# Patient Record
Sex: Male | Born: 1960 | Race: White | Hispanic: No | Marital: Married | Smoking: Current every day smoker
Health system: Southern US, Community
[De-identification: ages and names within clinical notes are randomized; demographics above are authoritative.]

## PROBLEM LIST (undated history)

## (undated) DIAGNOSIS — K649 Unspecified hemorrhoids: Secondary | ICD-10-CM

## (undated) DIAGNOSIS — I10 Essential (primary) hypertension: Secondary | ICD-10-CM

## (undated) DIAGNOSIS — M1990 Unspecified osteoarthritis, unspecified site: Secondary | ICD-10-CM

## (undated) DIAGNOSIS — K219 Gastro-esophageal reflux disease without esophagitis: Secondary | ICD-10-CM

## (undated) DIAGNOSIS — J309 Allergic rhinitis, unspecified: Secondary | ICD-10-CM

## (undated) HISTORY — DX: Allergic rhinitis, unspecified: J30.9

## (undated) HISTORY — DX: Unspecified hemorrhoids: K64.9

## (undated) HISTORY — DX: Gastro-esophageal reflux disease without esophagitis: K21.9

## (undated) HISTORY — PX: APPENDECTOMY: SHX54

## (undated) HISTORY — PX: KNEE SURGERY: SHX244

---

## 2013-06-11 ENCOUNTER — Other Ambulatory Visit (HOSPITAL_COMMUNITY): Payer: Self-pay | Admitting: Orthopaedic Surgery

## 2013-07-16 ENCOUNTER — Encounter (HOSPITAL_COMMUNITY): Payer: Self-pay | Admitting: Pharmacy Technician

## 2013-07-18 ENCOUNTER — Encounter (HOSPITAL_COMMUNITY)
Admission: RE | Admit: 2013-07-18 | Discharge: 2013-07-18 | Disposition: A | Discharge status: Home / Self Care | Payer: BC Managed Care – PPO | Source: Ambulatory Visit | Attending: Orthopaedic Surgery | Admitting: Orthopaedic Surgery

## 2013-07-18 ENCOUNTER — Encounter (HOSPITAL_COMMUNITY): Payer: Self-pay

## 2013-07-18 DIAGNOSIS — Z01810 Encounter for preprocedural cardiovascular examination: Secondary | ICD-10-CM | POA: Insufficient documentation

## 2013-07-18 DIAGNOSIS — Z01818 Encounter for other preprocedural examination: Secondary | ICD-10-CM | POA: Insufficient documentation

## 2013-07-18 DIAGNOSIS — Z01812 Encounter for preprocedural laboratory examination: Secondary | ICD-10-CM | POA: Insufficient documentation

## 2013-07-18 HISTORY — DX: Unspecified osteoarthritis, unspecified site: M19.90

## 2013-07-18 HISTORY — DX: Essential (primary) hypertension: I10

## 2013-07-18 LAB — COMPREHENSIVE METABOLIC PANEL
Albumin: 4.1 g/dL (ref 3.5–5.2)
BUN: 11 mg/dL (ref 6–23)
Chloride: 99 mEq/L (ref 96–112)
Creatinine, Ser: 0.94 mg/dL (ref 0.50–1.35)
GFR calc non Af Amer: >90 mL/min (ref >90)
Total Bilirubin: 0.3 mg/dL (ref 0.3–1.2)
Total Protein: 7.6 g/dL (ref 6.0–8.3)

## 2013-07-18 LAB — PROTIME-INR
INR: 1.11 (ref 0.00–1.49)
Prothrombin Time: 14.1 seconds (ref 11.6–15.2)

## 2013-07-18 LAB — URINALYSIS, ROUTINE W REFLEX MICROSCOPIC
Bilirubin Urine: NEGATIVE
Nitrite: NEGATIVE
Protein, ur: 30 mg/dL — AB
Specific Gravity, Urine: 1.015 (ref 1.005–1.030)
Urobilinogen, UA: 0.2 mg/dL (ref 0.0–1.0)

## 2013-07-18 LAB — CBC
HCT: 40.8 % (ref 39.0–52.0)
Hemoglobin: 14.2 g/dL (ref 13.0–17.0)
MCHC: 34.8 g/dL (ref 30.0–36.0)
MCV: 90.1 fL (ref 78.0–100.0)
RDW: 12.5 % (ref 11.5–15.5)
WBC: 8.1 10*3/uL (ref 4.0–10.5)

## 2013-07-18 LAB — ABO/RH: ABO/RH(D): A POS

## 2013-07-18 LAB — URINE MICROSCOPIC-ADD ON

## 2013-07-18 LAB — APTT: aPTT: 27 seconds (ref 24–37)

## 2013-07-18 LAB — TYPE AND SCREEN
ABO/RH(D): A POS
Antibody Screen: NEGATIVE

## 2013-07-18 NOTE — Pre-Procedure Instructions (Signed)
Vashaun Osmon  07/18/2013   Your procedure is scheduled on:  November 24  Report to [facility] Entrance "A" [address] at 10:30 AM.  Call this number if you have problems the morning of surgery: [phone]   Remember:   Do not eat food or drink liquids after midnight.   Take these medicines the morning of surgery with A SIP OF WATER: Hydrocodone (if needed)   STOP Aspirin, Aleve, Naproxen, Advil, Ibuprofen, Vitamin, Herbs, or Supplements starting today   Do not wear jewelry, make-up or nail polish.  Do not wear lotions, powders, or perfumes. You may wear deodorant.  Do not shave 48 hours prior to surgery. Men may shave face and neck.  Do not bring valuables to the hospital.  [facility] is not responsible                  for any belongings or valuables.               Contacts, dentures or bridgework may not be worn into surgery.  Leave suitcase in the car. After surgery it may be brought to your room.  For patients admitted to the hospital, discharge time is determined by your                treatment team.               Special Instructions: Shower using CHG 2 nights before surgery and the night before surgery.  If you shower the day of surgery use CHG.  Use special wash - you have one bottle of CHG for all showers.  You should use approximately 1/3 of the bottle for each shower.   Please read over the following fact sheets that you were given: Pain Booklet, Coughing and Deep Breathing, Blood Transfusion Information, Total Joint Packet and Surgical Site Infection Prevention

## 2013-07-23 NOTE — H&P (Signed)
TOTAL KNEE ADMISSION H&P  Patient is being admitted for right total knee arthroplasty.  Subjective:  Chief Complaint:right knee pain.  HPI: Gregory Marks, 52 y.o. male, has a history of pain and functional disability in the right knee due to arthritis and has failed non-surgical conservative treatments for greater than 12 weeks to includeNSAID's and/or analgesics and corticosteriod injections.  Onset of symptoms was gradual, starting 2 years ago with gradually worsening course since that time.  Patient currently rates pain in the right knee(s) at 7 out of 10 with activity. Patient has night pain, worsening of pain with activity and weight bearing and pain that interferes with activities of daily living.  Patient has evidence of periarticular osteophytes and joint space narrowing by imaging studies.There is no active infection.  He had a previous meniscectomy years ago and had problems with postoperative infection.  Had a washout procedure and PICC line IV antibiotics.  There are no active problems to display for this patient.  Past Medical History  Diagnosis Date  . Hypertension     does not see a cardiologist  . Osteoarthritis     Past Surgical History  Procedure Laterality Date  . Appendectomy      age 60  . Knee surgery  1976, 1992    right     No prescriptions prior to admission   No Known Allergies  History  Substance Use Topics  . Smoking status: Never Smoker   . Smokeless tobacco: Not on file  . Alcohol Use: Yes     Comment: reports beer every now and then    No family history on file.   Review of Systems  Musculoskeletal: Positive for joint pain.       Right knee pain  All other systems reviewed and are negative.    Objective:  Physical Exam  Constitutional: He is oriented to person, place, and time. He appears well-developed and well-nourished.  HENT:  Head: Normocephalic and atraumatic.  Eyes: EOM are normal. Pupils are equal, round, and reactive to light.   Neck: Normal range of motion.  Cardiovascular: Normal rate.   Respiratory: Effort normal.  GI: Soft.  Musculoskeletal:   Good hip range of motion, symmetrical.  Has 45 degrees internal/external rotation.  Knee reaches within 5 degrees full extension on the right.  He flexes 120 degrees on the right.  Opposite left knee shows full flexion and extension, 135-0 degrees.  No knee effusion on the left knee.  The right knee shows 2+ effusion.  Cruciate ligament exam is normal.  No calf tenderness.  Distal pulses are 2+.  Negative straight leg raising.  No popliteal masses.   Neurological: He is alert and oriented to person, place, and time.  Skin: Skin is warm and dry.  Psychiatric: He has a normal mood and affect.    Vital signs in last 24 hours:    Labs:   There is no height or weight on file to calculate BMI.   Imaging Review Plain radiographs demonstrate severe degenerative joint disease of the right knee(s). The overall alignment issignificant varus. The bone quality appears to be adequate for age and reported activity level.  Assessment/Plan:  End stage arthritis, right knee   The patient history, physical examination, clinical judgment of the provider and imaging studies are consistent with end stage degenerative joint disease of the right knee(s) and total knee arthroplasty is deemed medically necessary. The treatment options including medical management, injection therapy arthroscopy and arthroplasty were discussed at length.  The risks and benefits of total knee arthroplasty were presented and reviewed. The risks due to aseptic loosening, infection, stiffness, patella tracking problems, thromboembolic complications and other imponderables were discussed. The patient acknowledged the explanation, agreed to proceed with the plan and consent was signed. Patient is being admitted for inpatient treatment for surgery, pain control, PT, OT, prophylactic antibiotics, VTE prophylaxis,  progressive ambulation and ADL's and discharge planning. The patient is planning to be discharged home with home health services

## 2013-07-27 MED ORDER — CEFAZOLIN SODIUM-DEXTROSE 2-3 GM-% IV SOLR
2.0000 g | INTRAVENOUS | Status: AC
  Administered 2013-07-28: 2 g via INTRAVENOUS
  Filled 2013-07-27: qty 50

## 2013-07-28 ENCOUNTER — Encounter (HOSPITAL_COMMUNITY)
Admission: RE | Disposition: A | Discharge status: Home Health | Payer: Self-pay | Source: Ambulatory Visit | Attending: Orthopaedic Surgery

## 2013-07-28 ENCOUNTER — Encounter (HOSPITAL_COMMUNITY): Payer: BC Managed Care – PPO | Admitting: Certified Registered Nurse Anesthetist

## 2013-07-28 ENCOUNTER — Inpatient Hospital Stay (HOSPITAL_COMMUNITY)
Admission: RE | Admit: 2013-07-28 | Discharge: 2013-07-30 | DRG: 470 | Disposition: A | Discharge status: Home Health | Payer: BC Managed Care – PPO | Source: Ambulatory Visit | Attending: Orthopaedic Surgery | Admitting: Orthopaedic Surgery

## 2013-07-28 ENCOUNTER — Inpatient Hospital Stay (HOSPITAL_COMMUNITY): Payer: BC Managed Care – PPO | Admitting: Certified Registered Nurse Anesthetist

## 2013-07-28 ENCOUNTER — Encounter (HOSPITAL_COMMUNITY): Payer: Self-pay | Admitting: *Deleted

## 2013-07-28 DIAGNOSIS — I10 Essential (primary) hypertension: Secondary | ICD-10-CM | POA: Diagnosis present

## 2013-07-28 DIAGNOSIS — Z9089 Acquired absence of other organs: Secondary | ICD-10-CM

## 2013-07-28 DIAGNOSIS — M1711 Unilateral primary osteoarthritis, right knee: Secondary | ICD-10-CM | POA: Diagnosis present

## 2013-07-28 DIAGNOSIS — M1710 Unilateral primary osteoarthritis, unspecified knee: Principal | ICD-10-CM | POA: Diagnosis present

## 2013-07-28 HISTORY — PX: KNEE ARTHROPLASTY: SHX992

## 2013-07-28 SURGERY — ARTHROPLASTY, KNEE, TOTAL, USING IMAGELESS COMPUTER-ASSISTED NAVIGATION
Anesthesia: General | Site: Knee | Laterality: Right | Wound class: Clean

## 2013-07-28 MED ORDER — HYDROMORPHONE HCL PF 1 MG/ML IJ SOLN
INTRAMUSCULAR | Status: AC
  Administered 2013-07-28: 0.5 mg via INTRAVENOUS
  Filled 2013-07-28: qty 1

## 2013-07-28 MED ORDER — ACETAMINOPHEN 325 MG PO TABS: 650.0000 mg | ORAL_TABLET | Freq: Four times a day (QID) | ORAL | Status: DC | PRN

## 2013-07-28 MED ORDER — MIDAZOLAM HCL 2 MG/2ML IJ SOLN: 2.0000 mg | Freq: Once | INTRAMUSCULAR | Status: DC

## 2013-07-28 MED ORDER — ONDANSETRON HCL 4 MG PO TABS: 4.0000 mg | ORAL_TABLET | Freq: Four times a day (QID) | ORAL | Status: DC | PRN

## 2013-07-28 MED ORDER — OXYCODONE HCL 5 MG PO TABS: 5.0000 mg | ORAL_TABLET | Freq: Once | ORAL | Status: DC | PRN

## 2013-07-28 MED ORDER — LACTATED RINGERS IV SOLN
INTRAVENOUS | Status: DC | PRN
  Administered 2013-07-28 (×2): via INTRAVENOUS

## 2013-07-28 MED ORDER — ONDANSETRON HCL 4 MG/2ML IJ SOLN
INTRAMUSCULAR | Status: DC | PRN
  Administered 2013-07-28: 4 mg via INTRAVENOUS

## 2013-07-28 MED ORDER — AMLODIPINE BESYLATE 10 MG PO TABS
10.0000 mg | ORAL_TABLET | Freq: Every day | ORAL | Status: DC
  Administered 2013-07-29 – 2013-07-30 (×2): 10 mg via ORAL
  Filled 2013-07-28 (×2): qty 1

## 2013-07-28 MED ORDER — ARTIFICIAL TEARS OP OINT
TOPICAL_OINTMENT | OPHTHALMIC | Status: DC | PRN
  Administered 2013-07-28: 1 via OPHTHALMIC

## 2013-07-28 MED ORDER — METHOCARBAMOL 100 MG/ML IJ SOLN
500.0000 mg | Freq: Four times a day (QID) | INTRAVENOUS | Status: DC | PRN
  Filled 2013-07-28: qty 5

## 2013-07-28 MED ORDER — BUPIVACAINE-EPINEPHRINE PF 0.5-1:200000 % IJ SOLN
INTRAMUSCULAR | Status: DC | PRN
  Administered 2013-07-28: 25 mL via PERINEURAL

## 2013-07-28 MED ORDER — GLYCOPYRROLATE 0.2 MG/ML IJ SOLN
INTRAMUSCULAR | Status: DC | PRN
  Administered 2013-07-28: 0.6 mg via INTRAVENOUS

## 2013-07-28 MED ORDER — ONDANSETRON HCL 4 MG/2ML IJ SOLN
4.0000 mg | Freq: Four times a day (QID) | INTRAMUSCULAR | Status: DC | PRN
  Administered 2013-07-29: 4 mg via INTRAVENOUS
  Filled 2013-07-28: qty 2

## 2013-07-28 MED ORDER — METOCLOPRAMIDE HCL 5 MG/ML IJ SOLN
5.0000 mg | Freq: Three times a day (TID) | INTRAMUSCULAR | Status: DC | PRN
  Administered 2013-07-29: 10 mg via INTRAVENOUS
  Filled 2013-07-28: qty 2

## 2013-07-28 MED ORDER — METHOCARBAMOL 500 MG PO TABS
ORAL_TABLET | ORAL | Status: AC
  Administered 2013-07-29: 500 mg via ORAL
  Filled 2013-07-28: qty 1

## 2013-07-28 MED ORDER — KETOROLAC TROMETHAMINE 30 MG/ML IJ SOLN
INTRAMUSCULAR | Status: AC
  Filled 2013-07-28: qty 1

## 2013-07-28 MED ORDER — HYDROMORPHONE HCL PF 1 MG/ML IJ SOLN
0.2500 mg | INTRAMUSCULAR | Status: DC | PRN
  Administered 2013-07-28 (×3): 0.5 mg via INTRAVENOUS

## 2013-07-28 MED ORDER — FENTANYL CITRATE 0.05 MG/ML IJ SOLN
100.0000 ug | Freq: Once | INTRAMUSCULAR | Status: AC
  Administered 2013-07-28: 100 ug via INTRAVENOUS

## 2013-07-28 MED ORDER — ASPIRIN EC 325 MG PO TBEC: 325.0000 mg | DELAYED_RELEASE_TABLET | Freq: Every day | ORAL | Status: DC

## 2013-07-28 MED ORDER — FENTANYL CITRATE 0.05 MG/ML IJ SOLN
INTRAMUSCULAR | Status: AC
  Filled 2013-07-28: qty 2

## 2013-07-28 MED ORDER — AMLODIPINE BESY-BENAZEPRIL HCL 10-20 MG PO CAPS: 1.0000 | ORAL_CAPSULE | Freq: Every day | ORAL | Status: DC

## 2013-07-28 MED ORDER — ZOLPIDEM TARTRATE 5 MG PO TABS: 5.0000 mg | ORAL_TABLET | Freq: Every evening | ORAL | Status: DC | PRN

## 2013-07-28 MED ORDER — PHENOL 1.4 % MT LIQD: 1.0000 | OROMUCOSAL | Status: DC | PRN

## 2013-07-28 MED ORDER — MENTHOL 3 MG MT LOZG: 1.0000 | LOZENGE | OROMUCOSAL | Status: DC | PRN

## 2013-07-28 MED ORDER — METOCLOPRAMIDE HCL 10 MG PO TABS: 5.0000 mg | ORAL_TABLET | Freq: Three times a day (TID) | ORAL | Status: DC | PRN

## 2013-07-28 MED ORDER — OXYCODONE HCL 5 MG/5ML PO SOLN: 5.0000 mg | Freq: Once | ORAL | Status: DC | PRN

## 2013-07-28 MED ORDER — NEOSTIGMINE METHYLSULFATE 1 MG/ML IJ SOLN
INTRAMUSCULAR | Status: DC | PRN
  Administered 2013-07-28: 4 mg via INTRAVENOUS

## 2013-07-28 MED ORDER — ACETAMINOPHEN 650 MG RE SUPP: 650.0000 mg | Freq: Four times a day (QID) | RECTAL | Status: DC | PRN

## 2013-07-28 MED ORDER — MIDAZOLAM HCL 2 MG/2ML IJ SOLN
INTRAMUSCULAR | Status: AC
  Filled 2013-07-28: qty 2

## 2013-07-28 MED ORDER — FLEET ENEMA 7-19 GM/118ML RE ENEM: 1.0000 | ENEMA | Freq: Once | RECTAL | Status: AC | PRN

## 2013-07-28 MED ORDER — LIDOCAINE HCL (CARDIAC) 20 MG/ML IV SOLN
INTRAVENOUS | Status: DC | PRN
  Administered 2013-07-28: 80 mg via INTRAVENOUS

## 2013-07-28 MED ORDER — PROPOFOL 10 MG/ML IV BOLUS
INTRAVENOUS | Status: DC | PRN
  Administered 2013-07-28: 200 mg via INTRAVENOUS

## 2013-07-28 MED ORDER — BENAZEPRIL HCL 20 MG PO TABS
20.0000 mg | ORAL_TABLET | Freq: Every day | ORAL | Status: DC
  Administered 2013-07-29 – 2013-07-30 (×2): 20 mg via ORAL
  Filled 2013-07-28 (×2): qty 1

## 2013-07-28 MED ORDER — METHOCARBAMOL 500 MG PO TABS
500.0000 mg | ORAL_TABLET | Freq: Four times a day (QID) | ORAL | Status: DC | PRN
  Administered 2013-07-28 – 2013-07-29 (×4): 500 mg via ORAL
  Filled 2013-07-28 (×5): qty 1

## 2013-07-28 MED ORDER — DOCUSATE SODIUM 100 MG PO CAPS
100.0000 mg | ORAL_CAPSULE | Freq: Two times a day (BID) | ORAL | Status: DC
  Administered 2013-07-29 – 2013-07-30 (×3): 100 mg via ORAL
  Filled 2013-07-28 (×5): qty 1

## 2013-07-28 MED ORDER — OXYCODONE-ACETAMINOPHEN 5-325 MG PO TABS: 1.0000 | ORAL_TABLET | ORAL | Status: DC | PRN

## 2013-07-28 MED ORDER — METHOCARBAMOL 500 MG PO TABS: 500.0000 mg | ORAL_TABLET | Freq: Four times a day (QID) | ORAL | Status: DC | PRN

## 2013-07-28 MED ORDER — KCL IN DEXTROSE-NACL 20-5-0.45 MEQ/L-%-% IV SOLN
INTRAVENOUS | Status: DC
  Administered 2013-07-28 – 2013-07-29 (×2): via INTRAVENOUS
  Filled 2013-07-28 (×4): qty 1000

## 2013-07-28 MED ORDER — OXYCODONE HCL 5 MG PO TABS
ORAL_TABLET | ORAL | Status: AC
  Administered 2013-07-29: 10 mg via ORAL
  Filled 2013-07-28: qty 1

## 2013-07-28 MED ORDER — MIDAZOLAM HCL 2 MG/2ML IJ SOLN
INTRAMUSCULAR | Status: AC
  Administered 2013-07-28: 2 mg
  Filled 2013-07-28: qty 2

## 2013-07-28 MED ORDER — HYDROMORPHONE HCL PF 1 MG/ML IJ SOLN
1.0000 mg | INTRAMUSCULAR | Status: DC | PRN
  Administered 2013-07-28: 0.5 mg via INTRAVENOUS
  Administered 2013-07-28 – 2013-07-30 (×6): 1 mg via INTRAVENOUS
  Filled 2013-07-28 (×7): qty 1

## 2013-07-28 MED ORDER — SODIUM CHLORIDE 0.9 % IR SOLN
Status: DC | PRN
  Administered 2013-07-28: 3000 mL

## 2013-07-28 MED ORDER — ASPIRIN EC 325 MG PO TBEC
325.0000 mg | DELAYED_RELEASE_TABLET | Freq: Every day | ORAL | Status: DC
  Administered 2013-07-29 – 2013-07-30 (×2): 325 mg via ORAL
  Filled 2013-07-28 (×4): qty 1

## 2013-07-28 MED ORDER — DIPHENHYDRAMINE HCL 12.5 MG/5ML PO ELIX: 12.5000 mg | ORAL_SOLUTION | ORAL | Status: DC | PRN

## 2013-07-28 MED ORDER — KETOROLAC TROMETHAMINE 15 MG/ML IJ SOLN
15.0000 mg | Freq: Four times a day (QID) | INTRAMUSCULAR | Status: AC
  Administered 2013-07-28 – 2013-07-29 (×3): 15 mg via INTRAVENOUS
  Filled 2013-07-28 (×3): qty 1

## 2013-07-28 MED ORDER — MIDAZOLAM HCL 2 MG/2ML IJ SOLN
1.0000 mg | INTRAMUSCULAR | Status: AC | PRN
  Administered 2013-07-28 (×2): 1 mg via INTRAVENOUS

## 2013-07-28 MED ORDER — LACTATED RINGERS IV SOLN
INTRAVENOUS | Status: DC
  Administered 2013-07-28: 11:00:00 via INTRAVENOUS

## 2013-07-28 MED ORDER — METOCLOPRAMIDE HCL 5 MG/ML IJ SOLN: 10.0000 mg | Freq: Once | INTRAMUSCULAR | Status: DC | PRN

## 2013-07-28 MED ORDER — SENNOSIDES-DOCUSATE SODIUM 8.6-50 MG PO TABS: 1.0000 | ORAL_TABLET | Freq: Every evening | ORAL | Status: DC | PRN

## 2013-07-28 MED ORDER — OXYCODONE HCL 5 MG PO TABS
5.0000 mg | ORAL_TABLET | ORAL | Status: DC | PRN
  Administered 2013-07-28 – 2013-07-30 (×9): 10 mg via ORAL
  Filled 2013-07-28 (×9): qty 2

## 2013-07-28 MED ORDER — BISACODYL 10 MG RE SUPP: 10.0000 mg | Freq: Every day | RECTAL | Status: DC | PRN

## 2013-07-28 MED ORDER — DEXAMETHASONE SODIUM PHOSPHATE 4 MG/ML IJ SOLN
INTRAMUSCULAR | Status: DC | PRN
  Administered 2013-07-28: 8 mg via INTRAVENOUS

## 2013-07-28 MED ORDER — FENTANYL CITRATE 0.05 MG/ML IJ SOLN
INTRAMUSCULAR | Status: DC | PRN
  Administered 2013-07-28: 50 ug via INTRAVENOUS
  Administered 2013-07-28: 100 ug via INTRAVENOUS
  Administered 2013-07-28 (×2): 50 ug via INTRAVENOUS
  Administered 2013-07-28: 100 ug via INTRAVENOUS
  Administered 2013-07-28: 50 ug via INTRAVENOUS

## 2013-07-28 MED ORDER — ROCURONIUM BROMIDE 100 MG/10ML IV SOLN
INTRAVENOUS | Status: DC | PRN
  Administered 2013-07-28: 50 mg via INTRAVENOUS

## 2013-07-28 MED ORDER — MIDAZOLAM HCL 5 MG/5ML IJ SOLN
INTRAMUSCULAR | Status: DC | PRN
  Administered 2013-07-28: 2 mg via INTRAVENOUS

## 2013-07-28 SURGICAL SUPPLY — 66 items
BANDAGE ELASTIC 4 VELCRO ST LF (GAUZE/BANDAGES/DRESSINGS) ×2 IMPLANT
BANDAGE ELASTIC 6 VELCRO ST LF (GAUZE/BANDAGES/DRESSINGS) ×2 IMPLANT
BANDAGE ESMARK 6X9 LF (GAUZE/BANDAGES/DRESSINGS) ×1 IMPLANT
BENZOIN TINCTURE PRP APPL 2/3 (GAUZE/BANDAGES/DRESSINGS) ×2 IMPLANT
BLADE KNIFE  10 PERSONNA (BLADE) ×2 IMPLANT
BLADE SAGITTAL 25.0X1.19X90 (BLADE) ×2 IMPLANT
BLADE SAW SGTL 13X75X1.27 (BLADE) ×2 IMPLANT
BNDG ELASTIC 6X10 VLCR STRL LF (GAUZE/BANDAGES/DRESSINGS) ×2 IMPLANT
BNDG ESMARK 6X9 LF (GAUZE/BANDAGES/DRESSINGS) ×2
BOWL SMART MIX CTS (DISPOSABLE) ×2 IMPLANT
CAPT RP KNEE ×2 IMPLANT
CEMENT HV SMART SET (Cement) ×4 IMPLANT
CLOTH BEACON ORANGE TIMEOUT ST (SAFETY) ×2 IMPLANT
COVER BACK TABLE 24X17X13 BIG (DRAPES) IMPLANT
COVER SURGICAL LIGHT HANDLE (MISCELLANEOUS) ×2 IMPLANT
CUFF TOURNIQUET SINGLE 34IN LL (TOURNIQUET CUFF) ×2 IMPLANT
CUFF TOURNIQUET SINGLE 44IN (TOURNIQUET CUFF) IMPLANT
DRAPE ORTHO SPLIT 77X108 STRL (DRAPES) ×2
DRAPE SURG ORHT 6 SPLT 77X108 (DRAPES) ×2 IMPLANT
DRAPE U-SHAPE 47X51 STRL (DRAPES) ×2 IMPLANT
DRSG PAD ABDOMINAL 8X10 ST (GAUZE/BANDAGES/DRESSINGS) ×2 IMPLANT
DURAPREP 26ML APPLICATOR (WOUND CARE) ×2 IMPLANT
ELECT REM PT RETURN 9FT ADLT (ELECTROSURGICAL) ×2
ELECTRODE REM PT RTRN 9FT ADLT (ELECTROSURGICAL) ×1 IMPLANT
FACESHIELD LNG OPTICON STERILE (SAFETY) ×4 IMPLANT
GAUZE XEROFORM 5X9 LF (GAUZE/BANDAGES/DRESSINGS) ×2 IMPLANT
GLOVE BIOGEL PI IND STRL 7.5 (GLOVE) ×1 IMPLANT
GLOVE BIOGEL PI IND STRL 8 (GLOVE) ×1 IMPLANT
GLOVE BIOGEL PI INDICATOR 7.5 (GLOVE) ×1
GLOVE BIOGEL PI INDICATOR 8 (GLOVE) ×1
GLOVE ECLIPSE 7.0 STRL STRAW (GLOVE) ×2 IMPLANT
GLOVE ORTHO TXT STRL SZ7.5 (GLOVE) ×2 IMPLANT
GOWN PREVENTION PLUS LG XLONG (DISPOSABLE) IMPLANT
GOWN PREVENTION PLUS XLARGE (GOWN DISPOSABLE) ×2 IMPLANT
GOWN STRL NON-REIN LRG LVL3 (GOWN DISPOSABLE) ×6 IMPLANT
HANDPIECE INTERPULSE COAX TIP (DISPOSABLE) ×1
IMMOBILIZER KNEE 22 UNIV (SOFTGOODS) ×2 IMPLANT
KIT BASIN OR (CUSTOM PROCEDURE TRAY) ×2 IMPLANT
KIT ROOM TURNOVER OR (KITS) ×2 IMPLANT
MANIFOLD NEPTUNE II (INSTRUMENTS) ×2 IMPLANT
MARKER SPHERE PSV REFLC THRD 5 (MARKER) ×6 IMPLANT
NEEDLE 1/2 CIR MAYO (NEEDLE) IMPLANT
NEEDLE HYPO 25GX1X1/2 BEV (NEEDLE) ×2 IMPLANT
NS IRRIG 1000ML POUR BTL (IV SOLUTION) ×2 IMPLANT
PACK TOTAL JOINT (CUSTOM PROCEDURE TRAY) ×2 IMPLANT
PAD ARMBOARD 7.5X6 YLW CONV (MISCELLANEOUS) ×4 IMPLANT
PAD CAST 4YDX4 CTTN HI CHSV (CAST SUPPLIES) ×1 IMPLANT
PADDING CAST COTTON 4X4 STRL (CAST SUPPLIES) ×1
PADDING CAST COTTON 6X4 STRL (CAST SUPPLIES) ×2 IMPLANT
PIN SCHANZ 4MM 130MM (PIN) ×8 IMPLANT
SET HNDPC FAN SPRY TIP SCT (DISPOSABLE) ×1 IMPLANT
SPONGE GAUZE 4X4 12PLY (GAUZE/BANDAGES/DRESSINGS) ×2 IMPLANT
STAPLER VISISTAT 35W (STAPLE) ×4 IMPLANT
STRIP CLOSURE SKIN 1/2X4 (GAUZE/BANDAGES/DRESSINGS) ×4 IMPLANT
SUCTION FRAZIER TIP 10 FR DISP (SUCTIONS) ×2 IMPLANT
SUT ETHIBOND NAB CT1 #1 30IN (SUTURE) ×2 IMPLANT
SUT VIC AB 2-0 CT1 27 (SUTURE) ×2
SUT VIC AB 2-0 CT1 TAPERPNT 27 (SUTURE) ×2 IMPLANT
SUT VICRYL 4-0 PS2 18IN ABS (SUTURE) ×2 IMPLANT
SUT VICRYL AB 2 0 TIES (SUTURE) IMPLANT
SYR CONTROL 10ML LL (SYRINGE) ×2 IMPLANT
TOWEL OR 17X24 6PK STRL BLUE (TOWEL DISPOSABLE) ×2 IMPLANT
TOWEL OR 17X26 10 PK STRL BLUE (TOWEL DISPOSABLE) ×2 IMPLANT
TRAY FOLEY CATH 16FRSI W/METER (SET/KITS/TRAYS/PACK) ×2 IMPLANT
WATER STERILE IRR 1000ML POUR (IV SOLUTION) ×6 IMPLANT
YANKAUER SUCT BULB TIP NO VENT (SUCTIONS) ×2 IMPLANT

## 2013-07-28 NOTE — Anesthesia Procedure Notes (Signed)
Anesthesia Regional Block:  Femoral nerve block  Pre-Anesthetic Checklist: ,, timeout performed, Correct Patient, Correct Site, Correct Laterality, Correct Procedure, Correct Position, site marked, Risks and benefits discussed,  Surgical consent,  Pre-op evaluation,  At surgeon's request and post-op pain management  Laterality: Right  Prep: chloraprep       Needles:   Needle Type: Other     Needle Length: 9cm  Needle Gauge: 21 and 21 G    Additional Needles:  Procedures: ultrasound guided (picture in chart) Femoral nerve block Narrative:  Start time: 07/28/2013 11:18 AM End time: 07/28/2013 11:24 AM Injection made incrementally with aspirations every 5 mL.  Performed by: Personally  Anesthesiologist: C. Emmali Karow MD  Additional Notes: Ultrasound guidance used to: id relevant anatomy, confirm needle position, local anesthetic spread, avoidance of vascular puncture. Picture saved. No complications. Block performed personally by Janetta Hora. Gelene Mink, MD    Femoral nerve block

## 2013-07-28 NOTE — Preoperative (Signed)
Beta Blockers   Reason not to administer Beta Blockers:Not Applicable 

## 2013-07-28 NOTE — Interval H&P Note (Signed)
History and Physical Interval Note:  07/28/2013 12:15 PM  Gregory Marks  has presented today for surgery, with the diagnosis of Right knee osteoarthritis   The various methods of treatment have been discussed with the patient and family. After consideration of risks, benefits and other options for treatment, the patient has consented to  Procedure(s) with comments: COMPUTER ASSISTED TOTAL KNEE ARTHROPLASTY (Right) - Right Total Knee Arthroplasty as a surgical intervention .  The patient's history has been reviewed, patient examined, no change in status, stable for surgery.  I have reviewed the patient's chart and labs.  Questions were answered to the patient's satisfaction.     Minor Iden C

## 2013-07-28 NOTE — Interval H&P Note (Signed)
History and Physical Interval Note:  07/28/2013 12:16 PM  Gregory Marks  has presented today for surgery, with the diagnosis of Right knee osteoarthritis   The various methods of treatment have been discussed with the patient and family. After consideration of risks, benefits and other options for treatment, the patient has consented to  Procedure(s) with comments: COMPUTER ASSISTED TOTAL KNEE ARTHROPLASTY (Right) - Right Total Knee Arthroplasty as a surgical intervention .  The patient's history has been reviewed, patient examined, no change in status, stable for surgery.  I have reviewed the patient's chart and labs.  Questions were answered to the patient's satisfaction.     Joanne Salah C

## 2013-07-28 NOTE — Brief Op Note (Cosign Needed)
07/28/2013  3:17 PM  PATIENT:  Gregory Marks  52 y.o. male  PRE-OPERATIVE DIAGNOSIS:  Right knee osteoarthritis   POST-OPERATIVE DIAGNOSIS:  same  PROCEDURE:  Procedure(s) with comments: COMPUTER ASSISTED TOTAL KNEE ARTHROPLASTY (Right) - Right Total Knee Arthroplasty  SURGEON:  Surgeon(s) and Role:    * Eldred Manges, MD - Primary  PHYSICIAN ASSISTANT: Maud Deed [facility]  ASSISTANTS: none   ANESTHESIA:   general  EBL:  Total I/O In: 1600 [I.V.:1600] Out: 250 [Urine:200; Blood:50]  BLOOD ADMINISTERED:none  DRAINS: none   LOCAL MEDICATIONS USED:  NONE  SPECIMEN:  No Specimen  DISPOSITION OF SPECIMEN:  N/A  COUNTS:  YES  TOURNIQUET:   Total Tourniquet Time Documented: Thigh (Right) - 87 minutes Total: Thigh (Right) - 87 minutes   DICTATION: .Note written in EPIC  PLAN OF CARE: Admit to inpatient   PATIENT DISPOSITION:  PACU - hemodynamically stable.   Delay start of Pharmacological VTE agent (>24hrs) due to surgical blood loss or risk of bleeding: no

## 2013-07-28 NOTE — Progress Notes (Signed)
Orthopedic Tech Progress Note Patient Details:  Gregory Marks [birth year] [id number]  CPM Right Knee CPM Right Knee: On Right Knee Flexion (Degrees): 60 Right Knee Extension (Degrees): 0 Additional Comments: Foot roll   Cammer, Mickie Bail 07/28/2013, 3:30 PM

## 2013-07-28 NOTE — Transfer of Care (Signed)
Immediate Anesthesia Transfer of Care Note  Patient: Gregory Marks  Procedure(s) Performed: Procedure(s) with comments: COMPUTER ASSISTED TOTAL KNEE ARTHROPLASTY (Right) - Right Total Knee Arthroplasty  Patient Location: PACU  Anesthesia Type:General  Level of Consciousness: awake, alert , oriented and patient cooperative  Airway & Oxygen Therapy: Patient Spontanous Breathing and Patient connected to nasal cannula oxygen  Post-op Assessment: Report given to PACU RN, Post -op Vital signs reviewed and stable and Patient moving all extremities X 4  Post vital signs: Reviewed and stable  Complications: No apparent anesthesia complications

## 2013-07-28 NOTE — Anesthesia Preprocedure Evaluation (Addendum)
Anesthesia Evaluation  Patient identified by MRN, date of birth, ID band Patient awake    Reviewed: Allergy & Precautions, H&P , NPO status , Patient's Chart, lab work & pertinent test results, reviewed documented beta blocker date and time   Airway Mallampati: II TM Distance: >3 FB Neck ROM: full    Dental   Pulmonary neg pulmonary ROS,  breath sounds clear to auscultation        Cardiovascular hypertension, negative cardio ROS  Rhythm:regular     Neuro/Psych negative neurological ROS  negative psych ROS   GI/Hepatic negative GI ROS, Neg liver ROS,   Endo/Other  negative endocrine ROS  Renal/GU negative Renal ROS  negative genitourinary   Musculoskeletal   Abdominal   Peds  Hematology negative hematology ROS (+)   Anesthesia Other Findings See surgeon's H&P   Reproductive/Obstetrics negative OB ROS                          Anesthesia Physical Anesthesia Plan  ASA: II  Anesthesia Plan: General   Post-op Pain Management:    Induction: Intravenous  Airway Management Planned: Oral ETT  Additional Equipment:   Intra-op Plan:   Post-operative Plan: Extubation in OR  Informed Consent: I have reviewed the patients History and Physical, chart, labs and discussed the procedure including the risks, benefits and alternatives for the proposed anesthesia with the patient or authorized representative who has indicated his/her understanding and acceptance.   Dental Advisory Given  Plan Discussed with: CRNA and Surgeon  Anesthesia Plan Comments:        Anesthesia Quick Evaluation

## 2013-07-29 ENCOUNTER — Encounter (HOSPITAL_COMMUNITY): Payer: Self-pay | Admitting: Orthopaedic Surgery

## 2013-07-29 LAB — CBC
Hemoglobin: 13.2 g/dL (ref 13.0–17.0)
MCHC: 35.5 g/dL (ref 30.0–36.0)
Platelets: 222 10*3/uL (ref 150–400)
RBC: 4.04 MIL/uL — ABNORMAL LOW (ref 4.22–5.81)
RDW: 12.4 % (ref 11.5–15.5)
WBC: 13.8 10*3/uL — ABNORMAL HIGH (ref 4.0–10.5)

## 2013-07-29 LAB — BASIC METABOLIC PANEL
CO2: 28 mEq/L (ref 19–32)
GFR calc non Af Amer: >90 mL/min (ref >90)
Glucose, Bld: 138 mg/dL — ABNORMAL HIGH (ref 70–99)
Potassium: 3.7 mEq/L (ref 3.5–5.1)
Sodium: 139 mEq/L (ref 135–145)

## 2013-07-29 MED ORDER — ACETAMINOPHEN 10 MG/ML IV SOLN
1000.0000 mg | Freq: Four times a day (QID) | INTRAVENOUS | Status: DC
  Administered 2013-07-29 – 2013-07-30 (×3): 1000 mg via INTRAVENOUS
  Filled 2013-07-29 (×4): qty 100

## 2013-07-29 MED ORDER — INDOMETHACIN SODIUM 1 MG IV SOLR
0.1000 mg/kg | Freq: Once | INTRAVENOUS | Status: DC
  Filled 2013-07-29: qty 9

## 2013-07-29 MED ORDER — PREGABALIN 50 MG PO CAPS
75.0000 mg | ORAL_CAPSULE | Freq: Two times a day (BID) | ORAL | Status: DC
  Administered 2013-07-29 – 2013-07-30 (×2): 75 mg via ORAL
  Filled 2013-07-29 (×4): qty 1

## 2013-07-29 MED ORDER — INDOMETHACIN ER 75 MG PO CPCR
75.0000 mg | ORAL_CAPSULE | Freq: Two times a day (BID) | ORAL | Status: DC
  Administered 2013-07-29 – 2013-07-30 (×2): 75 mg via ORAL
  Filled 2013-07-29 (×5): qty 1

## 2013-07-29 NOTE — Evaluation (Signed)
Occupational Therapy Evaluation and Discharge Patient Details Name: Anthany Thornhill MRN: [id number] DOB: [birth year] Today's Date: 07/29/2013 Time: 1012-1030 OT Time Calculation (min): 18 min  OT Assessment / Plan / Recommendation History of present illness RTKA   Clinical Impression   This 52 yo male s/p above presents to acute OT with all education completed, will D/C from acute OT    OT Assessment  Patient does not need any further OT services    Follow Up Recommendations  No OT follow up       Equipment Recommendations  None recommended by OT          Precautions / Restrictions Precautions Precautions: Knee Required Braces or Orthoses: Knee Immobilizer - Right Knee Immobilizer - Right: Discontinue once straight leg raise with < 10 degree lag Restrictions Weight Bearing Restrictions: No   Pertinent Vitals/Pain 5/10 RLE; repositioned    ADL  Equipment Used: Gait belt;Knee Immobilizer;Rolling walker Transfers/Ambulation Related to ADLs: min guard A for all with RW and KI ADL Comments: Wife to A prn for LBADLs, pt S for toliet transfer and we verbally went over how to step into/out of walk in shower     Acute Rehab OT Goals Patient Stated Goal: Home tomorrow  Visit Information  Last OT Received On: 07/29/13 Assistance Needed: +1 PT/OT Co-Evaluation/Treatment: Yes (partial) History of Present Illness: RTKA       Prior Functioning     Home Living Family/patient expects to be discharged to:: Private residence Living Arrangements: Spouse/significant other Available Help at Discharge: Family;Available 24 hours/day Type of Home: House Home Access: Stairs to enter [employer] of Steps: 3 Entrance Stairs-Rails: Left Home Layout: One level Home Equipment: Walker - 2 wheels;Shower seat Prior Function Level of Independence: Independent Communication Communication: No difficulties Dominant Hand: Right         Vision/Perception Vision -  History Patient Visual Report: No change from baseline   Cognition  Cognition Arousal/Alertness: Awake/alert Behavior During Therapy: WFL for tasks assessed/performed Overall Cognitive Status: Within Functional Limits for tasks assessed    Extremity/Trunk Assessment Upper Extremity Assessment Upper Extremity Assessment: Overall WFL for tasks assessed Lower Extremity Assessment Lower Extremity Assessment: Defer to PT evaluation     Mobility Bed Mobility Bed Mobility: Supine to Sit;Sitting - Scoot to Edge of Bed Supine to Sit: 6: Modified independent (Device/Increase time);HOB elevated;With rails Sitting - Scoot to Edge of Bed: 6: Modified independent (Device/Increase time);With rail Transfers Transfers: Sit to Stand;Stand to Sit Sit to Stand: 4: Min guard;With upper extremity assist;From bed Stand to Sit: 4: Min guard;With upper extremity assist;With armrests;To chair/3-in-1 Details for Transfer Assistance: VCs for safe hand placement           End of Session OT - End of Session Equipment Utilized During Treatment: Gait belt;Rolling walker;Right knee immobilizer Activity Tolerance: Patient tolerated treatment well Patient left: in chair;with call bell/phone within reach;with family/visitor present CPM Right Knee CPM Right Knee: [address]       Evette Georges [phone] 07/29/2013, 10:36 AM

## 2013-07-29 NOTE — Anesthesia Postprocedure Evaluation (Signed)
Anesthesia Post Note  Patient: Gregory Marks  Procedure(s) Performed: Procedure(s) (LRB): COMPUTER ASSISTED TOTAL KNEE ARTHROPLASTY (Right)  Anesthesia type: General  Patient location: PACU  Post pain: Pain level controlled  Post assessment: Patient's Cardiovascular Status Stable  Last Vitals:  Filed Vitals:   07/29/13 0613  BP: 158/88  Pulse: 108  Temp: 36.9 C  Resp: 20    Post vital signs: Reviewed and stable  Level of consciousness: alert  Complications: No apparent anesthesia complications

## 2013-07-29 NOTE — Progress Notes (Signed)
Physical Therapy Treatment Patient Details Name: Gregory Marks MRN: [id number] DOB: [birth year] Today's Date: 07/29/2013 Time: 7829-5621 PT Time Calculation (min): 18 min  PT Assessment / Plan / Recommendation  History of Present Illness Pt is a 52 y/o male admitted s/p R TKA on 07/28/13   PT Comments   Pt significantly limited by pain during session. 8/10 at rest, however pt agreed to bed exercise. Session focused on therapeutic exercise, as pt was ambulating in hallway with nursing in afternoon. All exercises were AAROM, and at end of session pt reported 10/10 pain. Nursing was notified.   Follow Up Recommendations  Home health PT     Does the patient have the potential to tolerate intense rehabilitation     Barriers to Discharge        Equipment Recommendations  3in1 (PT)    Recommendations for Other Services    Frequency 7X/week   Progress towards PT Goals Progress towards PT goals: Progressing toward goals  Plan Current plan remains appropriate    Precautions / Restrictions Precautions Precautions: Knee;Fall Required Braces or Orthoses: Knee Immobilizer - Right Knee Immobilizer - Right: Discontinue once straight leg raise with < 10 degree lag Restrictions Weight Bearing Restrictions: Yes RLE Weight Bearing: Weight bearing as tolerated   Pertinent Vitals/Pain See above for pain ratings    Mobility  Bed Mobility Bed Mobility: Not assessed Supine to Sit: 6: Modified independent (Device/Increase time);HOB elevated;With rails Sitting - Scoot to Edge of Bed: 6: Modified independent (Device/Increase time);With rail Details for Bed Mobility Assistance: Pt was able to perform transition to EOB with use of bed rails for assist and increased time due to pain. Transfers Transfers: Not assessed Sit to Stand: 4: Min guard;With upper extremity assist;From bed Stand to Sit: 4: Min guard;With upper extremity assist;With armrests;To chair/3-in-1 Details for Transfer Assistance: VC's  for hand placement on seated surface and to extend R LE out for comfort if needed.  Ambulation/Gait Ambulation/Gait Assistance: Not tested (comment) Ambulation Distance (Feet): 45 Feet Assistive device: Rolling walker Ambulation/Gait Assistance Details: VC's for sequencing and safety awareness with the RW. Gait Pattern: Step-to pattern;Decreased stride length Gait velocity: Decreased Stairs: No    Exercises Total Joint Exercises Ankle Circles/Pumps: 10 reps Quad Sets: 10 reps Gluteal Sets: 10 reps Short Arc Quad: 10 reps Heel Slides: 10 reps Hip ABduction/ADduction: 10 reps Straight Leg Raises: 10 reps   PT Diagnosis: Difficulty walking;Acute pain  PT Problem List: Decreased strength;Decreased range of motion;Decreased activity tolerance;Decreased balance;Decreased mobility;Decreased knowledge of use of DME;Decreased safety awareness;Pain PT Treatment Interventions: DME instruction;Gait training;Stair training;Functional mobility training;Therapeutic activities;Therapeutic exercise;Neuromuscular re-education;Patient/family education   PT Goals (current goals can now be found in the care plan section) Acute Rehab PT Goals Patient Stated Goal: Home tomorrow PT Goal Formulation: With patient/family Time For Goal Achievement: 08/05/13 Potential to Achieve Goals: Good  Visit Information  Last PT Received On: 07/29/13 Assistance Needed: +1 History of Present Illness: Pt is a 52 y/o male admitted s/p R TKA on 07/28/13    Subjective Data  Subjective: "It hurts so bad right now." Patient Stated Goal: Home tomorrow   Cognition  Cognition Arousal/Alertness: Awake/alert Behavior During Therapy: WFL for tasks assessed/performed Overall Cognitive Status: Within Functional Limits for tasks assessed    Balance  Balance Balance Assessed: No  End of Session PT - End of Session Equipment Utilized During Treatment: Gait belt;Right knee immobilizer Activity Tolerance: Patient limited by  pain Patient left: in bed;with call bell/phone within reach;with family/visitor present Nurse  Communication: Patient requests pain meds CPM Right Knee CPM Right Knee: Off   GP     Ruthann Cancer 07/29/2013, 4:59 PM  Ruthann Cancer, PT, DPT [phone]

## 2013-07-29 NOTE — Progress Notes (Signed)
Subjective: 1 Day Post-Op Procedure(s) (LRB): COMPUTER ASSISTED TOTAL KNEE ARTHROPLASTY (Right) Patient reports pain as 8 on 0-10 scale.    Objective: Vital signs in last 24 hours: Temp:  [97.1 F (36.2 C)-98.7 F (37.1 C)] 98.4 F (36.9 C) (11/25 4696) Pulse Rate:  [70-113] 108 (11/25 0613) Resp:  [7-20] 20 (11/25 0613) BP: (130-163)/(72-100) 158/88 mmHg (11/25 0613) SpO2:  [96 %-100 %] 98 % (11/25 2952)  Intake/Output from previous day: 11/24 0701 - 11/25 0700 In: 2898.8 [P.O.:120; I.V.:2603.8] Out: 2900 [Urine:2850; Blood:50] Intake/Output this shift:     Recent Labs  07/29/13 0500  HGB 13.2    Recent Labs  07/29/13 0500  WBC 13.8*  RBC 4.04*  HCT 37.2*  PLT 222   No results found for this basename: NA, K, CL, CO2, BUN, CREATININE, GLUCOSE, CALCIUM,  in the last 72 hours No results found for this basename: LABPT, INR,  in the last 72 hours  Neurologically intact  Assessment/Plan: 1 Day Post-Op Procedure(s) (LRB): COMPUTER ASSISTED TOTAL KNEE ARTHROPLASTY (Right) Up with therapy  Gregory Marks C 07/29/2013, 7:38 AM

## 2013-07-29 NOTE — Progress Notes (Signed)
07/29/13 PT recommended HHPT, no OT needs. Spoke with patient and his wife about [facility]. They chose [facility]. Contacted [facility] with [facility] and set up HHPT. Patient states that he has a rolling walker and a 3N1 at home. No equipment needs identified. Will continue  to follow for d/c needs. Jacquelynn Cree RN, BSN, CCM

## 2013-07-29 NOTE — Op Note (Signed)
NAME:  Gregory, Marks NO.:  [id number]  MEDICAL RECORD NO.:  [id number]  LOCATION:  5N11C                        FACILITY:  [facility]  PHYSICIAN:  Quinto Tippy C. Ophelia Charter, M.D.    DATE OF BIRTH:  [birth year]  DATE OF PROCEDURE:  07/28/2013 DATE OF DISCHARGE:                              OPERATIVE REPORT   PREOPERATIVE DIAGNOSIS:  Right knee osteoarthritis.  POSTOPERATIVE DIAGNOSIS:  Right knee osteoarthritis.  PROCEDURE:  Right cemented total knee arthroplasty, computer assist.  SURGEON:  Jaydi Bray C. Ophelia Charter, M.D.  ASSISTANT:  Wende Neighbors, PA-C  ANESTHESIA:  General plus preoperative femoral nerve block.  EBL:  Minimal.  TOURNIQUET TIME:  1 hour and 25 minutes.  DRAINS:  None.  This 52 year old male has had progressive right knee osteoarthritis, varus deformity, 10-degree flexion contracture with pain.  Failed conservative treatment, anti-inflammatories, intra-articular injections, and walking aids.  X-rays demonstrated bone-on-bone changes, sclerosis, marginal osteophytes, and tricompartmental degenerative changes.  PROCEDURE:  After induction of general anesthesia, Foley catheter placement, lateral post, heel bump, proximal thigh tourniquet over a stockinette, the usual DuraPrep was used, impervious stockinette, Coban, half sheet above and below, sticky U drape, and split sheets were applied.  Sterile skin marker, Betadine, Steri-Drape by application. Time-out procedure was completed.  2 g of Ancef was given prophylactically.  PCR was negative.  After time-out, leg was wrapped in Esmarch, tourniquet inflated at 350 and deflated at the end of the case for hemostasis prior to closure.  After inflation of the tourniquet, Esmarch was removed, midline incision was made where the skin had been marked.  Knee capsule was opened with a medial parapatellar incision splitting the quad tendon between the medial one-third and lateral two- thirds.  There was large 2 cm spurs  on the patella, which were resected and 10 mm of bone was resected with an oscillating saw of facet sizing for 42 mm 3 PEG patella and drilled.  Large 1 cm spurs were removed off of the femur and tibia.  There was eburnated sclerotic grade 4 changes tricompartmental.  Veress deformity.  Pins were inserted, modules were made for the tibia and femur, which showed 12 degree flexion contracture, and 7.8 degrees of varus.  Pinning of the femur was performed first, none was taken off the high side.  Verification showed it was in 2 degrees of internal rotation. With knee flexion, this did not appear apparent and there was no notching anteriorly after anterior and posterior cuts were made.  There was large posterior spurs present.  PCL and ACL resected and menisci remnants were resected.  There was scarring medially where the patient had previous partial medial meniscectomy.  Spurs were removed posteriorly up the femur with 3/4 curved osteotome and due to the varus deformity medial collateral ligament, deep fibers were stripped with 3/4 curved osteotome.  Tibia was prepared, femur was #4, tibia was #4.patella was 42 mm. Spacer 10 mm rotating platform.Initially after cuts were made removing 5 off the low, 8 off the high, additional 2 mm had to be cut due to some posterior tightness and inability to put in a 10 mm trial.  Once the resection was performed taking an  additional 2 mm, trials were inserted, some additional posterior capsule was resected directly off the femur with the knee in flexed position.  All remnant of the PCL were resected and the knee would extend all the way to full extension, had 2 degree hyperextension.  Varus valgus alignment showed within 1 mm with medial side being slightly tied as expected with the preoperative varus deformity.  Flexion/extension gaps were symmetrical. Lug nuts on the femur were drilled.  Trials were removed.  Pulsatile lavage vacuum mixing of the  cement placed on the tibia followed by femur, 10 mm spacer inserted, and then the patella was placed.  All excess cement was removed sequentially.  Cement was hard at 15 minutes, tourniquet was deflated.  Hemostasis was obtained and then standard layered closure.  Stab incisions mid tibia for the computer pins were removed and closed with 2-0 Vicryl, subcuticular.  Ethibond #1 in the deep layer quad tendon, medial retinaculum 2-0 Vicryl subcutaneous tissue, superficial retinaculum and subcuticular skin closure.  Tincture of benzoin, Steri-Strips, application of postoperative dressing and knee immobilizer, and transferred to the recovery room in stable condition.     Chee Kinslow C. Ophelia Charter, M.D.     MCY/MEDQ  D:  07/28/2013  T:  07/29/2013  Job:  [id number]

## 2013-07-29 NOTE — Evaluation (Addendum)
Physical Therapy Evaluation Patient Details Name: Gregory Marks MRN: [id number] DOB: September 16, 1960 Today's Date: 07/29/2013 Time: 2841-3244 PT Time Calculation (min): 29 min  PT Assessment / Plan / Recommendation History of Present Illness  Pt is a 52 y/o male admitted s/p R TKA on 07/28/13  Clinical Impression  This patient presents with acute pain and decreased functional independence following the above mentioned procedure. At the time of PT eval, pt required min guard for transfers and ambulation. This patient is appropriate for skilled PT interventions to address functional limitations, improve safety and independence with functional mobility, and return to PLOF.    PT Assessment  Patient needs continued PT services    Follow Up Recommendations  Home health PT    Does the patient have the potential to tolerate intense rehabilitation      Barriers to Discharge        Equipment Recommendations  3in1 (PT)    Recommendations for Other Services     Frequency 7X/week    Precautions / Restrictions Precautions Precautions: Knee Required Braces or Orthoses: Knee Immobilizer - Right Knee Immobilizer - Right: Discontinue once straight leg raise with < 10 degree lag Restrictions Weight Bearing Restrictions: No   Pertinent Vitals/Pain Pt reports minimal pain at end of session.       Mobility  Bed Mobility Bed Mobility: Supine to Sit;Sitting - Scoot to Edge of Bed Supine to Sit: 6: Modified independent (Device/Increase time);HOB elevated;With rails Sitting - Scoot to Edge of Bed: 6: Modified independent (Device/Increase time);With rail Details for Bed Mobility Assistance: Pt was able to perform transition to EOB with use of bed rails for assist and increased time due to pain. Transfers Transfers: Sit to Stand;Stand to Sit Sit to Stand: 4: Min guard;With upper extremity assist;From bed Stand to Sit: 4: Min guard;With upper extremity assist;With armrests;To chair/3-in-1 Details  for Transfer Assistance: VC's for hand placement on seated surface and to extend R LE out for comfort if needed.  Ambulation/Gait Ambulation/Gait Assistance: 4: Min guard Ambulation Distance (Feet): 45 Feet Assistive device: Rolling walker Ambulation/Gait Assistance Details: VC's for sequencing and safety awareness with the RW. Gait Pattern: Step-to pattern;Decreased stride length Gait velocity: Decreased Stairs: No    Exercises  x10 each of quad sets, heel slides, ankle pumps 5-67 AAROM R Knee   PT Diagnosis: Difficulty walking;Acute pain  PT Problem List: Decreased strength;Decreased range of motion;Decreased activity tolerance;Decreased balance;Decreased mobility;Decreased knowledge of use of DME;Decreased safety awareness;Pain PT Treatment Interventions: DME instruction;Gait training;Stair training;Functional mobility training;Therapeutic activities;Therapeutic exercise;Neuromuscular re-education;Patient/family education     PT Goals(Current goals can be found in the care plan section) Acute Rehab PT Goals Patient Stated Goal: Home tomorrow PT Goal Formulation: With patient/family Time For Goal Achievement: 08/05/13 Potential to Achieve Goals: Good  Visit Information  Last PT Received On: 07/29/13 Assistance Needed: +1 History of Present Illness: Pt is a 52 y/o male admitted s/p R TKA on 07/28/13       Prior Functioning  Home Living Family/patient expects to be discharged to:: Private residence Living Arrangements: Spouse/significant other Available Help at Discharge: Family;Available 24 hours/day Type of Home: House Home Access: Stairs to enter [employer] of Steps: 3 Entrance Stairs-Rails: Left Home Layout: One level Home Equipment: Walker - 2 wheels;Shower seat Prior Function Level of Independence: Independent Communication Communication: No difficulties Dominant Hand: Right    Cognition  Cognition Arousal/Alertness: Awake/alert Behavior During  Therapy: WFL for tasks assessed/performed Overall Cognitive Status: Within Functional Limits for tasks assessed  Extremity/Trunk Assessment Upper Extremity Assessment Upper Extremity Assessment: Defer to OT evaluation Lower Extremity Assessment Lower Extremity Assessment: RLE deficits/detail RLE Deficits / Details: Decreased strength and AROM consistent with R TKA RLE: Unable to fully assess due to pain Cervical / Trunk Assessment Cervical / Trunk Assessment: Normal   Balance    End of Session PT - End of Session Equipment Utilized During Treatment: Gait belt;Right knee immobilizer Activity Tolerance: Patient tolerated treatment well Patient left: in chair;with call bell/phone within reach;with family/visitor present Nurse Communication: Mobility status CPM Right Knee CPM Right Knee: Off  GP     Ruthann Cancer 07/29/2013, 1:31 PM  Ruthann Cancer, PT, DPT [phone]

## 2013-07-30 LAB — CBC
MCH: 32 pg (ref 26.0–34.0)
MCV: 93.5 fL (ref 78.0–100.0)
Platelets: 187 10*3/uL (ref 150–400)
RBC: 3.56 MIL/uL — ABNORMAL LOW (ref 4.22–5.81)
WBC: 13.6 10*3/uL — ABNORMAL HIGH (ref 4.0–10.5)

## 2013-07-30 NOTE — Progress Notes (Signed)
Patient provided with discharge instructions and follow up information. He is going home with HHPT for continued rehabilitation. He is going home with support at this time.

## 2013-07-30 NOTE — Progress Notes (Addendum)
Subjective: 2 Days Post-Op Procedure(s) (LRB): COMPUTER ASSISTED TOTAL KNEE ARTHROPLASTY (Right) Patient reports pain as mild.    Objective: Vital signs in last 24 hours: Temp:  [98 F (36.7 C)-98.4 F (36.9 C)] 98 F (36.7 C) (11/26 0524) Pulse Rate:  [95-96] 95 (11/26 0524) Resp:  [18] 18 (11/26 0524) BP: (117-148)/(64-77) 122/73 mmHg (11/26 0524) SpO2:  [94 %-100 %] 94 % (11/26 0524)  Intake/Output from previous day: 11/25 0701 - 11/26 0700 In: 900 [I.V.:900] Out: 1200 [Urine:1200] Intake/Output this shift: Total I/O In: 128.5 [I.V.:128.5] Out: -    Recent Labs  07/29/13 0500 07/30/13 0540  HGB 13.2 11.4*    Recent Labs  07/29/13 0500 07/30/13 0540  WBC 13.8* 13.6*  RBC 4.04* 3.56*  HCT 37.2* 33.3*  PLT 222 187    Recent Labs  07/29/13 0500  NA 139  K 3.7  CL 98  CO2 28  BUN 13  CREATININE 0.99  GLUCOSE 138*  CALCIUM 8.9   No results found for this basename: LABPT, INR,  in the last 72 hours  Neurologically intact  Assessment/Plan: 2 Days Post-Op Procedure(s) (LRB): COMPUTER ASSISTED TOTAL KNEE ARTHROPLASTY (Right) Up with therapy  D/C Home today  After he does stairs with therapy.   YATES,MARK C 07/30/2013, 10:10 AM

## 2013-07-30 NOTE — Progress Notes (Signed)
Physical Therapy Treatment Patient Details Name: Gregory Marks MRN: [id number] DOB: [birth year] Today's Date: 07/30/2013 Time: 4098-1191 PT Time Calculation (min): 20 min  PT Assessment / Plan / Recommendation  History of Present Illness Pt is a 52 y/o male admitted s/p R TKA on 07/28/13   PT Comments   Pt making good progress with mobility.  Increased ambulation distance.  He states pain better controlled today.  Eager to d/c home this afternoon.  Pt needs to practice steps prior to d/c home.     Follow Up Recommendations  Home health PT     Does the patient have the potential to tolerate intense rehabilitation     Barriers to Discharge        Equipment Recommendations  3in1 (PT)    Recommendations for Other Services    Frequency 7X/week   Progress towards PT Goals Progress towards PT goals: Progressing toward goals  Plan Current plan remains appropriate    Precautions / Restrictions Precautions Precautions: Knee;Fall Required Braces or Orthoses: Knee Immobilizer - Right Knee Immobilizer - Right: Discontinue once straight leg raise with < 10 degree lag Restrictions Weight Bearing Restrictions: Yes RLE Weight Bearing: Weight bearing as tolerated   Pertinent Vitals/Pain 5/10 Rt knee.  Repositioned for comfort.  Premedicated.      Mobility  Bed Mobility Bed Mobility: Supine to Sit;Sitting - Scoot to Edge of Bed Supine to Sit: 6: Modified independent (Device/Increase time);HOB flat Sitting - Scoot to Edge of Bed: 6: Modified independent (Device/Increase time) Transfers Transfers: Sit to Stand;Stand to Sit Sit to Stand: 4: Min guard;With upper extremity assist;From bed Stand to Sit: 4: Min guard;With upper extremity assist;With armrests;To chair/3-in-1 Details for Transfer Assistance: Pt demonstrated safe hand placement  Ambulation/Gait Ambulation/Gait Assistance: 4: Min guard Ambulation Distance (Feet): 150 Feet Assistive device: Rolling walker Ambulation/Gait  Assistance Details: Cues for increased heel strike RLE, encouragement to increase WBing through RLE.  Pt demonstrates proper sequencing & safe use of walker Gait Pattern: Step-through pattern;Decreased stride length;Decreased step length - left;Decreased weight shift to right;Antalgic Stairs: No Wheelchair Mobility Wheelchair Mobility: No    Exercises Total Joint Exercises Ankle Circles/Pumps: AROM;Both;10 reps Quad Sets: AROM;Strengthening;Both;10 reps Heel Slides: AAROM;Strengthening;Right;10 reps Hip ABduction/ADduction: AAROM;Strengthening;Right;10 reps Straight Leg Raises: AAROM;Strengthening;Right;10 reps     PT Goals (current goals can now be found in the care plan section) Acute Rehab PT Goals Patient Stated Goal: Home today PT Goal Formulation: With patient/family Time For Goal Achievement: 08/05/13 Potential to Achieve Goals: Good  Visit Information  Last PT Received On: 07/30/13 Assistance Needed: +1 History of Present Illness: Pt is a 52 y/o male admitted s/p R TKA on 07/28/13    Subjective Data  Patient Stated Goal: Home today   Cognition  Cognition Arousal/Alertness: Awake/alert Behavior During Therapy: WFL for tasks assessed/performed Overall Cognitive Status: Within Functional Limits for tasks assessed    Balance     End of Session PT - End of Session Equipment Utilized During Treatment: Right knee immobilizer Activity Tolerance: Patient tolerated treatment well Patient left: in chair;with call bell/phone within reach Nurse Communication: Mobility status CPM Right Knee CPM Right Knee: Off Additional Comments: completed 3 hours   GP     Lara Mulch 07/30/2013, 8:59 AM   Verdell Face, PTA [phone] 07/30/2013

## 2013-08-11 NOTE — Discharge Summary (Signed)
Physician Discharge Summary  Patient ID: Gregory Marks MRN: [id number] DOB/AGE: 1961-03-04 52 y.o.  Admit date: 07/28/2013 Discharge date: 07/30/2013  Admission Diagnoses:  Osteoarthritis of right knee  Discharge Diagnoses:  Principal Problem:   Osteoarthritis of right knee   Past Medical History  Diagnosis Date  . Hypertension     does not see a cardiologist  . Osteoarthritis     Surgeries: Procedure(s): COMPUTER ASSISTED RIGHT TOTAL KNEE ARTHROPLASTY on 07/28/2013   Consultants (if any):  NONE  Discharged Condition: Improved  Hospital Course: Gregory Marks is an 52 y.o. male who was admitted 07/28/2013 with a diagnosis of Osteoarthritis of right knee and went to the operating room on 07/28/2013 and underwent the above named procedures.    He was given perioperative antibiotics:  Anti-infectives   Start     Dose/Rate Route Frequency Ordered Stop   07/28/13 0600  ceFAZolin (ANCEF) IVPB 2 g/50 mL premix     2 g 100 mL/hr over 30 Minutes Intravenous On call to O.R. 07/27/13 1429 07/28/13 1234    .  He was given sequential compression devices, early ambulation, and ASPIRIN for DVT prophylaxis.  He benefited maximally from the hospital stay and there were no complications.    Recent vital signs:  Filed Vitals:   07/30/13 0524  BP: 122/73  Pulse: 95  Temp: 98 F (36.7 C)  Resp: 18    Recent laboratory studies:  Lab Results  Component Value Date   HGB 11.4* 07/30/2013   HGB 13.2 07/29/2013   HGB 14.2 07/18/2013   Lab Results  Component Value Date   WBC 13.6* 07/30/2013   PLT 187 07/30/2013   Lab Results  Component Value Date   INR 1.11 07/18/2013   Lab Results  Component Value Date   NA 139 07/29/2013   K 3.7 07/29/2013   CL 98 07/29/2013   CO2 28 07/29/2013   BUN 13 07/29/2013   CREATININE 0.99 07/29/2013   GLUCOSE 138* 07/29/2013    Discharge Medications:     Medication List    STOP taking these medications       HYDROcodone-acetaminophen 5-325 MG per tablet  Commonly known as:  NORCO/VICODIN      TAKE these medications       amLODipine-benazepril 10-20 MG per capsule  Commonly known as:  LOTREL  Take 1 capsule by mouth daily.     aspirin EC 325 MG tablet  Take 1 tablet (325 mg total) by mouth daily.     methocarbamol 500 MG tablet  Commonly known as:  ROBAXIN  Take 1 tablet (500 mg total) by mouth every 6 (six) hours as needed for muscle spasms (spasm).     oxyCODONE-acetaminophen 5-325 MG per tablet  Commonly known as:  ROXICET  Take 1-2 tablets by mouth every 4 (four) hours as needed for severe pain.        Diagnostic Studies: Dg Chest 2 View  07/18/2013   CLINICAL DATA:  Preop osteoarthritis knee  EXAM: CHEST  2 VIEW  COMPARISON:  None.  FINDINGS: The heart size and mediastinal contours are within normal limits. Both lungs are clear. The visualized skeletal structures are unremarkable.  IMPRESSION: No active cardiopulmonary disease.   Electronically Signed   By: Gregory Marks M.D.   On: 07/18/2013 12:01    Disposition: 06-Home-Health Care Svc  Discharge instructions: Keep knee incision dry for 5 days post op then may wet while bathing. Therapy daily and goal full extension and greater than 90  degrees flexion. Call if fever or chills or increased drainage. Go to ER if acutely short of breath or call for ambulance. Return for follow up in 2 weeks. May full weight bear on the surgical leg unless told otherwise. Use knee immobilizer until able to straight leg raise off bed with knee stable. In house walking for first 2 weeks.  Follow-up Information   Follow up with Gregory Manges, MD. Schedule an appointment as soon as possible for a visit in 2 weeks.   Specialty:  Orthopedic Surgery   Contact information:   [address] [place] [address] [phone]        Signed: Wende Marks 08/11/2013, 9:33 AM

## 2013-09-04 HISTORY — PX: JOINT REPLACEMENT: SHX530

## 2014-10-01 IMAGING — CR DG CHEST 2V
2 series · 2 of 2 positions shown · non-contrast
Comparison: None.

CLINICAL DATA: Preop osteoarthritis knee

EXAM:
CHEST  2 VIEW

[w chest pa]
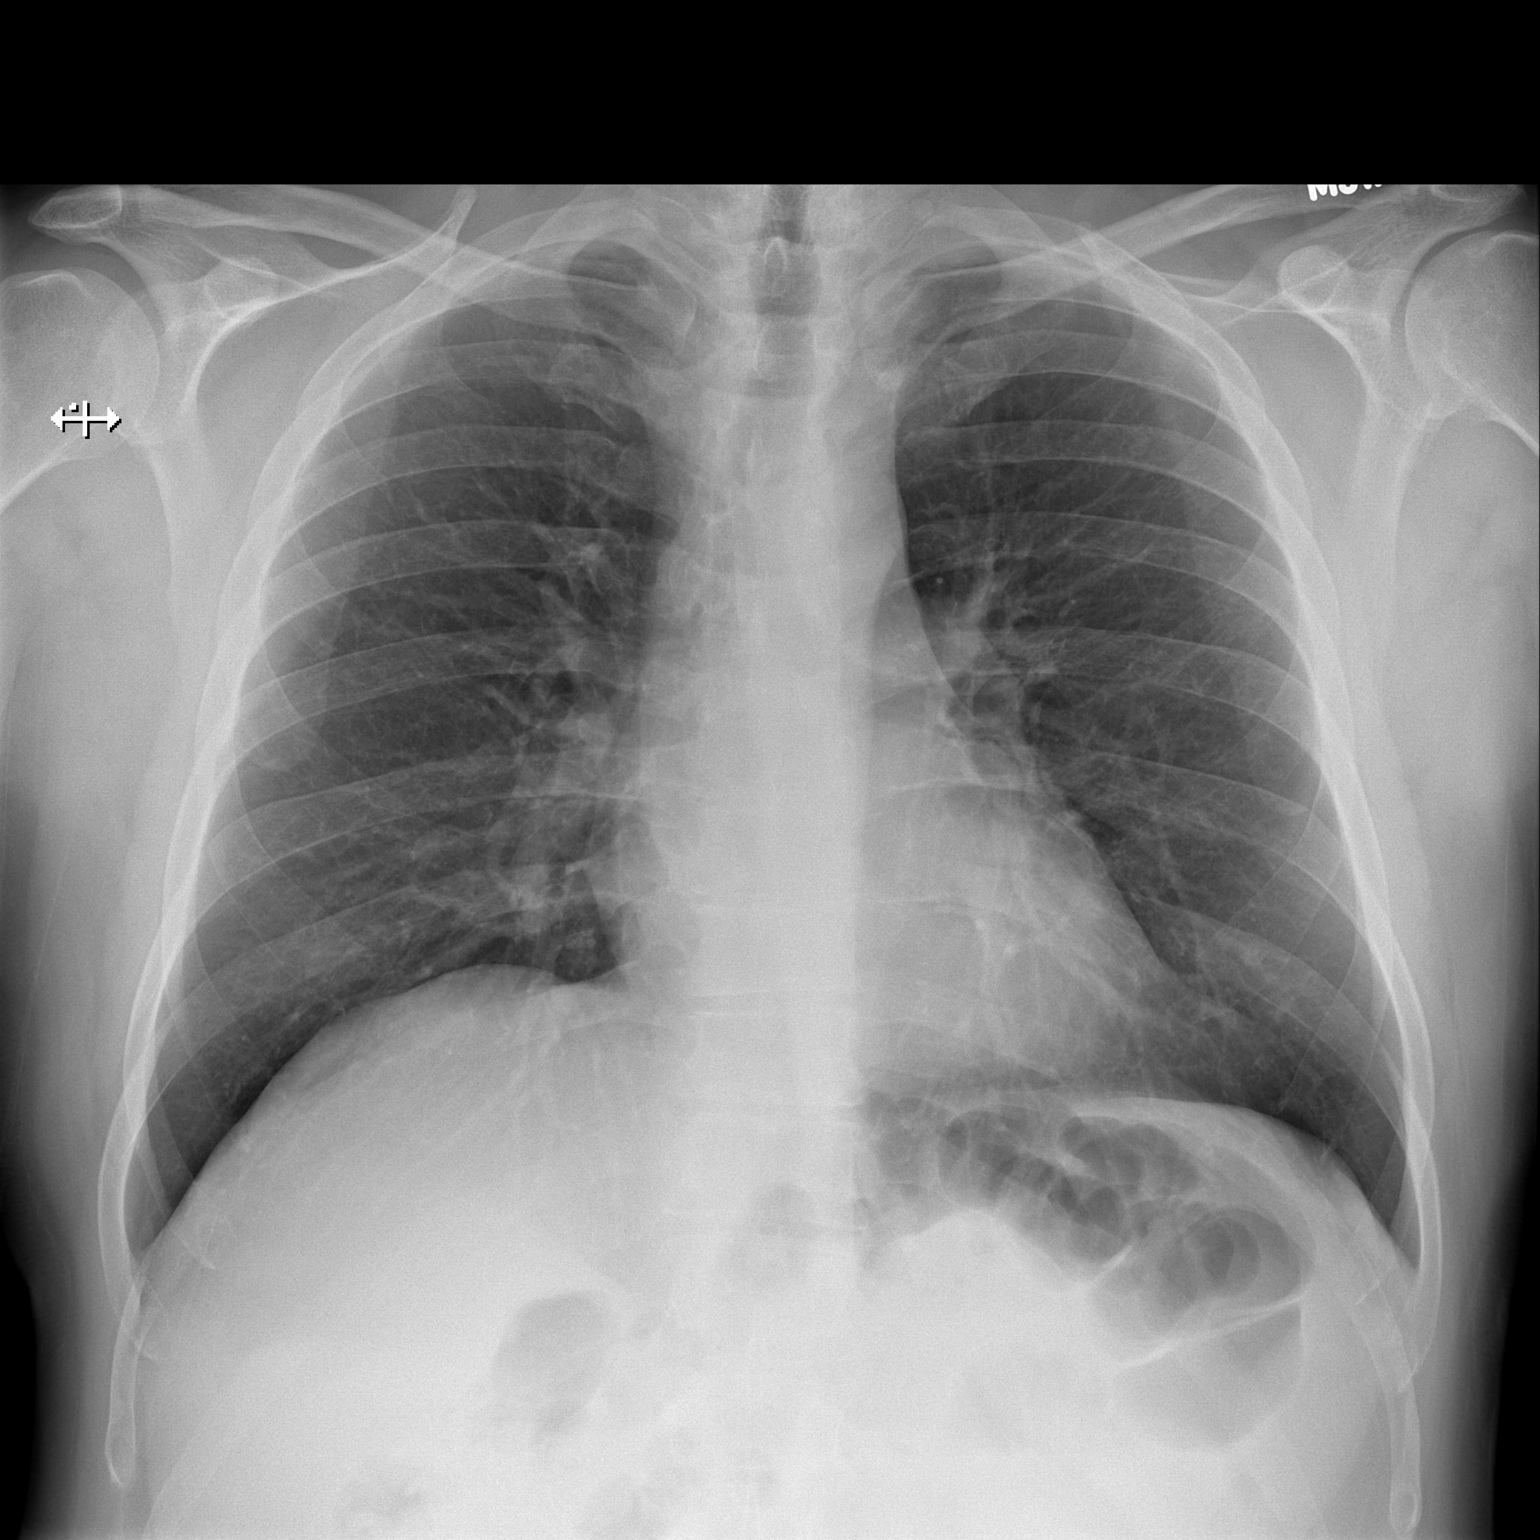

[w chest lat]
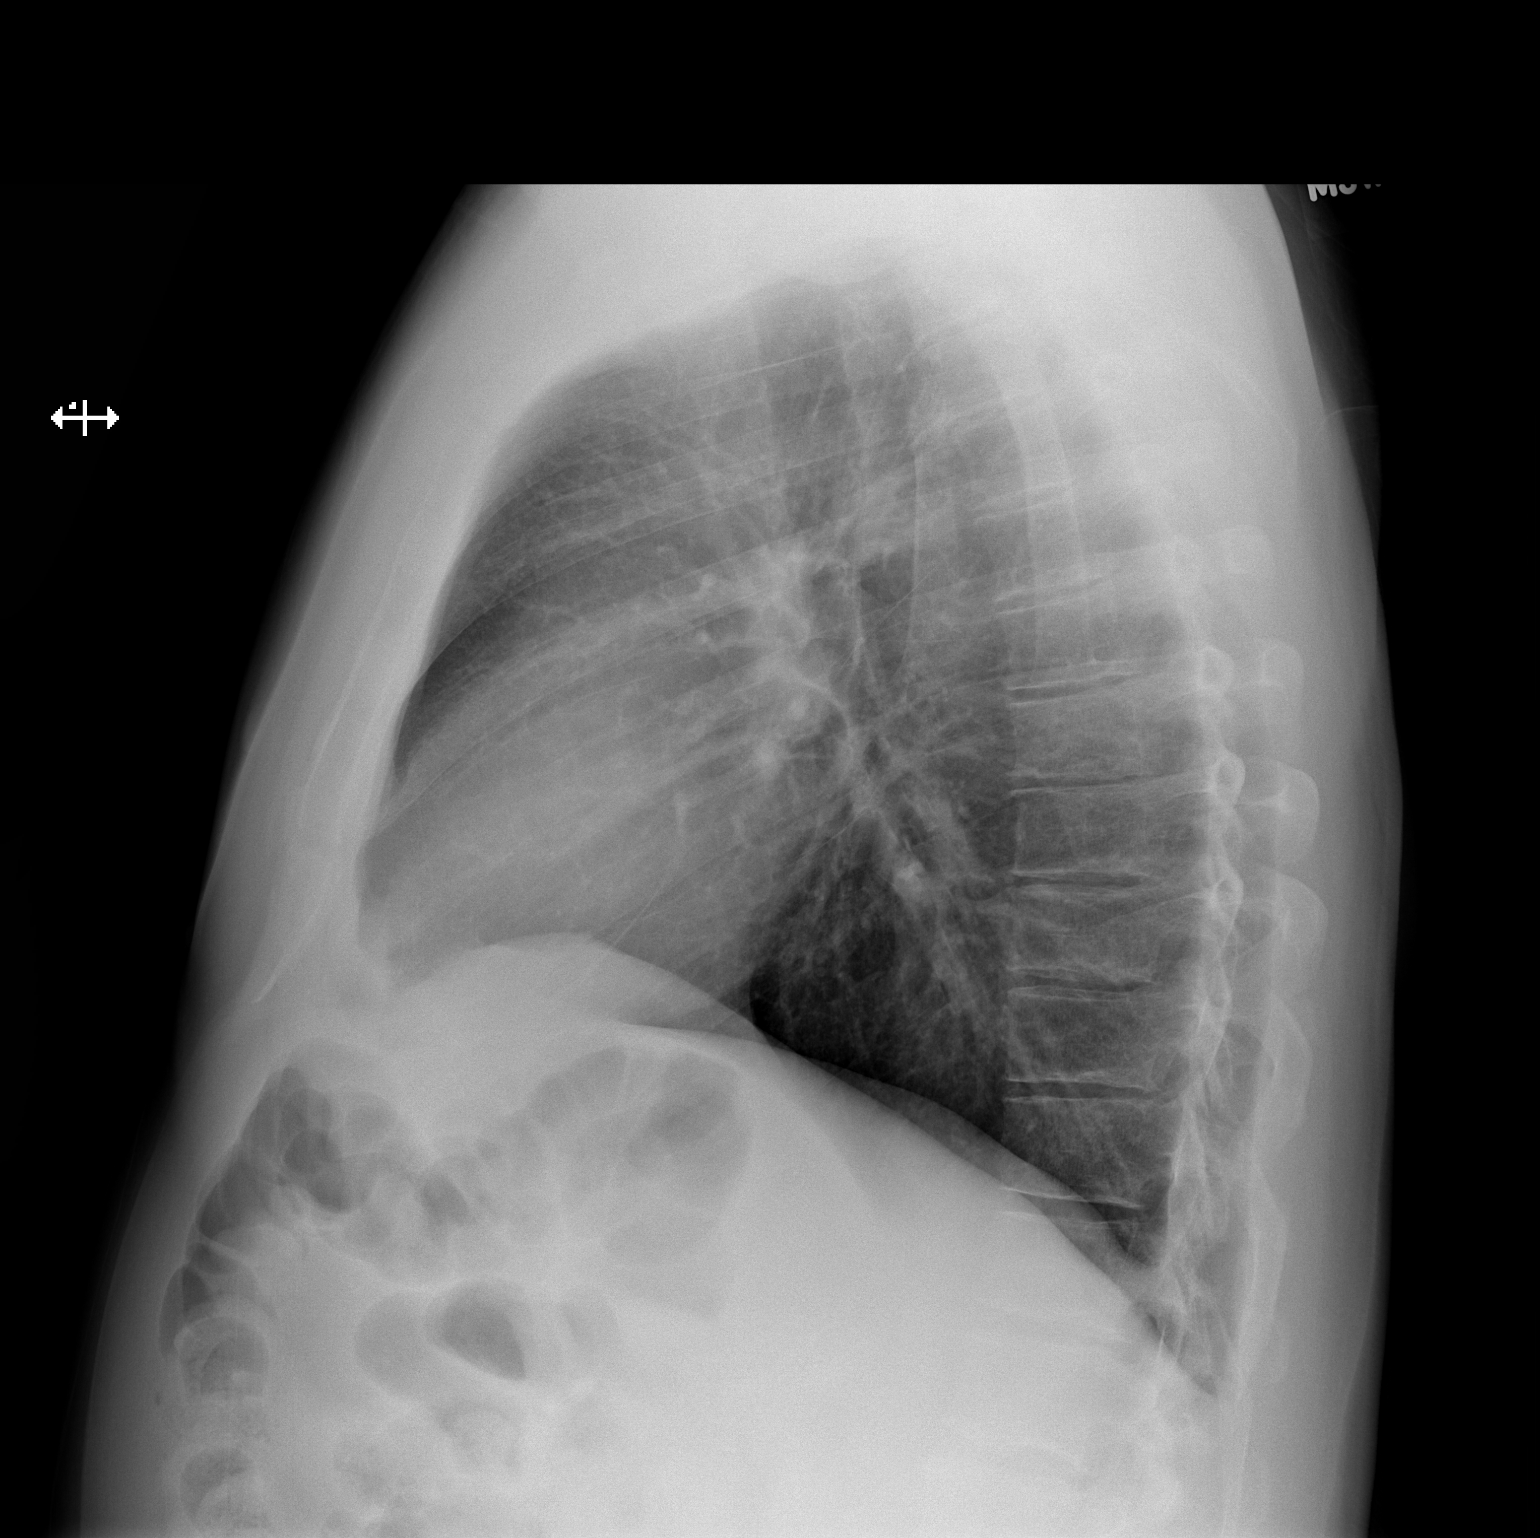

[2 of 2 positions shown; findings below may reference images not displayed]

FINDINGS: The heart size and mediastinal contours are within normal limits.
Both lungs are clear. The visualized skeletal structures are
unremarkable.
IMPRESSION: No active cardiopulmonary disease.

## 2018-03-29 DIAGNOSIS — H6123 Impacted cerumen, bilateral: Secondary | ICD-10-CM | POA: Diagnosis not present

## 2018-05-16 DIAGNOSIS — H9313 Tinnitus, bilateral: Secondary | ICD-10-CM | POA: Diagnosis not present

## 2018-05-16 DIAGNOSIS — H9193 Unspecified hearing loss, bilateral: Secondary | ICD-10-CM | POA: Diagnosis not present

## 2018-05-16 DIAGNOSIS — H6122 Impacted cerumen, left ear: Secondary | ICD-10-CM | POA: Diagnosis not present

## 2018-05-16 DIAGNOSIS — H6121 Impacted cerumen, right ear: Secondary | ICD-10-CM | POA: Diagnosis not present

## 2018-07-09 DIAGNOSIS — J411 Mucopurulent chronic bronchitis: Secondary | ICD-10-CM | POA: Diagnosis not present

## 2018-09-27 DIAGNOSIS — Z6832 Body mass index (BMI) 32.0-32.9, adult: Secondary | ICD-10-CM | POA: Diagnosis not present

## 2018-09-27 DIAGNOSIS — H6120 Impacted cerumen, unspecified ear: Secondary | ICD-10-CM | POA: Diagnosis not present

## 2018-09-27 DIAGNOSIS — I10 Essential (primary) hypertension: Secondary | ICD-10-CM | POA: Diagnosis not present

## 2018-09-27 DIAGNOSIS — N529 Male erectile dysfunction, unspecified: Secondary | ICD-10-CM | POA: Diagnosis not present

## 2018-11-07 DIAGNOSIS — R072 Precordial pain: Secondary | ICD-10-CM | POA: Diagnosis not present

## 2018-11-07 DIAGNOSIS — R079 Chest pain, unspecified: Secondary | ICD-10-CM | POA: Diagnosis not present

## 2019-03-28 ENCOUNTER — Encounter: Payer: Self-pay | Admitting: Gastroenterology

## 2019-04-22 DIAGNOSIS — Z20828 Contact with and (suspected) exposure to other viral communicable diseases: Secondary | ICD-10-CM | POA: Diagnosis not present

## 2019-06-17 DIAGNOSIS — Z0000 Encounter for general adult medical examination without abnormal findings: Secondary | ICD-10-CM | POA: Diagnosis not present

## 2019-06-17 DIAGNOSIS — Z79899 Other long term (current) drug therapy: Secondary | ICD-10-CM | POA: Diagnosis not present

## 2019-06-17 DIAGNOSIS — G8929 Other chronic pain: Secondary | ICD-10-CM | POA: Diagnosis not present

## 2019-06-17 DIAGNOSIS — Z131 Encounter for screening for diabetes mellitus: Secondary | ICD-10-CM | POA: Diagnosis not present

## 2019-06-17 DIAGNOSIS — M545 Low back pain: Secondary | ICD-10-CM | POA: Diagnosis not present

## 2019-06-17 DIAGNOSIS — Z13220 Encounter for screening for lipoid disorders: Secondary | ICD-10-CM | POA: Diagnosis not present

## 2019-06-17 DIAGNOSIS — Z1159 Encounter for screening for other viral diseases: Secondary | ICD-10-CM | POA: Diagnosis not present

## 2019-06-17 DIAGNOSIS — Z114 Encounter for screening for human immunodeficiency virus [HIV]: Secondary | ICD-10-CM | POA: Diagnosis not present

## 2019-07-08 DIAGNOSIS — Z79899 Other long term (current) drug therapy: Secondary | ICD-10-CM | POA: Diagnosis not present

## 2019-07-08 DIAGNOSIS — M545 Low back pain: Secondary | ICD-10-CM | POA: Diagnosis not present

## 2019-07-08 DIAGNOSIS — E785 Hyperlipidemia, unspecified: Secondary | ICD-10-CM | POA: Diagnosis not present

## 2019-07-08 DIAGNOSIS — G8929 Other chronic pain: Secondary | ICD-10-CM | POA: Diagnosis not present

## 2019-07-14 DIAGNOSIS — Z20828 Contact with and (suspected) exposure to other viral communicable diseases: Secondary | ICD-10-CM | POA: Diagnosis not present

## 2019-08-11 DIAGNOSIS — G8929 Other chronic pain: Secondary | ICD-10-CM | POA: Diagnosis not present

## 2019-08-11 DIAGNOSIS — I10 Essential (primary) hypertension: Secondary | ICD-10-CM | POA: Diagnosis not present

## 2019-08-11 DIAGNOSIS — M545 Low back pain: Secondary | ICD-10-CM | POA: Diagnosis not present

## 2019-08-11 DIAGNOSIS — Z79899 Other long term (current) drug therapy: Secondary | ICD-10-CM | POA: Diagnosis not present

## 2019-08-21 DIAGNOSIS — Z1211 Encounter for screening for malignant neoplasm of colon: Secondary | ICD-10-CM | POA: Diagnosis not present

## 2019-09-09 DIAGNOSIS — Z79899 Other long term (current) drug therapy: Secondary | ICD-10-CM | POA: Diagnosis not present

## 2019-09-09 DIAGNOSIS — M545 Low back pain: Secondary | ICD-10-CM | POA: Diagnosis not present

## 2019-09-09 DIAGNOSIS — G8929 Other chronic pain: Secondary | ICD-10-CM | POA: Diagnosis not present

## 2019-09-09 DIAGNOSIS — I10 Essential (primary) hypertension: Secondary | ICD-10-CM | POA: Diagnosis not present

## 2019-10-04 DIAGNOSIS — Z20828 Contact with and (suspected) exposure to other viral communicable diseases: Secondary | ICD-10-CM | POA: Diagnosis not present

## 2019-10-08 DIAGNOSIS — N289 Disorder of kidney and ureter, unspecified: Secondary | ICD-10-CM | POA: Diagnosis not present

## 2019-10-08 DIAGNOSIS — G8929 Other chronic pain: Secondary | ICD-10-CM | POA: Diagnosis not present

## 2019-10-08 DIAGNOSIS — M545 Low back pain: Secondary | ICD-10-CM | POA: Diagnosis not present

## 2019-10-08 DIAGNOSIS — I10 Essential (primary) hypertension: Secondary | ICD-10-CM | POA: Diagnosis not present

## 2019-10-08 DIAGNOSIS — Z79899 Other long term (current) drug therapy: Secondary | ICD-10-CM | POA: Diagnosis not present

## 2019-10-08 DIAGNOSIS — E0590 Thyrotoxicosis, unspecified without thyrotoxic crisis or storm: Secondary | ICD-10-CM | POA: Diagnosis not present

## 2019-10-08 DIAGNOSIS — E785 Hyperlipidemia, unspecified: Secondary | ICD-10-CM | POA: Diagnosis not present

## 2019-11-05 DIAGNOSIS — G8929 Other chronic pain: Secondary | ICD-10-CM | POA: Diagnosis not present

## 2019-11-05 DIAGNOSIS — N289 Disorder of kidney and ureter, unspecified: Secondary | ICD-10-CM | POA: Diagnosis not present

## 2019-11-05 DIAGNOSIS — M545 Low back pain: Secondary | ICD-10-CM | POA: Diagnosis not present

## 2019-11-05 DIAGNOSIS — E0590 Thyrotoxicosis, unspecified without thyrotoxic crisis or storm: Secondary | ICD-10-CM | POA: Diagnosis not present

## 2019-11-05 DIAGNOSIS — Z79899 Other long term (current) drug therapy: Secondary | ICD-10-CM | POA: Diagnosis not present

## 2019-11-09 DIAGNOSIS — I10 Essential (primary) hypertension: Secondary | ICD-10-CM | POA: Diagnosis not present

## 2019-11-09 DIAGNOSIS — R1110 Vomiting, unspecified: Secondary | ICD-10-CM | POA: Diagnosis not present

## 2019-12-04 DIAGNOSIS — I10 Essential (primary) hypertension: Secondary | ICD-10-CM | POA: Diagnosis not present

## 2019-12-04 DIAGNOSIS — M545 Low back pain: Secondary | ICD-10-CM | POA: Diagnosis not present

## 2019-12-04 DIAGNOSIS — G8929 Other chronic pain: Secondary | ICD-10-CM | POA: Diagnosis not present

## 2019-12-04 DIAGNOSIS — Z79899 Other long term (current) drug therapy: Secondary | ICD-10-CM | POA: Diagnosis not present

## 2020-01-05 DIAGNOSIS — G8929 Other chronic pain: Secondary | ICD-10-CM | POA: Diagnosis not present

## 2020-01-05 DIAGNOSIS — E785 Hyperlipidemia, unspecified: Secondary | ICD-10-CM | POA: Diagnosis not present

## 2020-01-05 DIAGNOSIS — Z79899 Other long term (current) drug therapy: Secondary | ICD-10-CM | POA: Diagnosis not present

## 2020-01-05 DIAGNOSIS — I10 Essential (primary) hypertension: Secondary | ICD-10-CM | POA: Diagnosis not present

## 2020-01-05 DIAGNOSIS — M545 Low back pain: Secondary | ICD-10-CM | POA: Diagnosis not present

## 2020-01-05 DIAGNOSIS — N289 Disorder of kidney and ureter, unspecified: Secondary | ICD-10-CM | POA: Diagnosis not present

## 2020-02-04 DIAGNOSIS — Z79899 Other long term (current) drug therapy: Secondary | ICD-10-CM | POA: Diagnosis not present

## 2020-02-04 DIAGNOSIS — I10 Essential (primary) hypertension: Secondary | ICD-10-CM | POA: Diagnosis not present

## 2020-02-04 DIAGNOSIS — N289 Disorder of kidney and ureter, unspecified: Secondary | ICD-10-CM | POA: Diagnosis not present

## 2020-02-04 DIAGNOSIS — M791 Myalgia, unspecified site: Secondary | ICD-10-CM | POA: Diagnosis not present

## 2020-07-17 DIAGNOSIS — I10 Essential (primary) hypertension: Secondary | ICD-10-CM | POA: Diagnosis not present

## 2020-07-17 DIAGNOSIS — M791 Myalgia, unspecified site: Secondary | ICD-10-CM | POA: Diagnosis not present

## 2020-07-17 DIAGNOSIS — N289 Disorder of kidney and ureter, unspecified: Secondary | ICD-10-CM | POA: Diagnosis not present

## 2020-09-04 DIAGNOSIS — E785 Hyperlipidemia, unspecified: Secondary | ICD-10-CM

## 2020-09-04 HISTORY — DX: Hyperlipidemia, unspecified: E78.5

## 2020-11-04 ENCOUNTER — Ambulatory Visit: Payer: BC Managed Care – PPO | Admitting: Nurse Practitioner

## 2020-11-04 ENCOUNTER — Encounter: Payer: Self-pay | Admitting: Nurse Practitioner

## 2020-11-04 ENCOUNTER — Other Ambulatory Visit: Payer: Self-pay

## 2020-11-04 ENCOUNTER — Other Ambulatory Visit: Payer: Self-pay | Admitting: Nurse Practitioner

## 2020-11-04 VITALS — BP 132/78 | HR 98 | Temp 97.8°F | Ht 69.0 in | Wt 233.0 lb

## 2020-11-04 DIAGNOSIS — K219 Gastro-esophageal reflux disease without esophagitis: Secondary | ICD-10-CM | POA: Diagnosis not present

## 2020-11-04 DIAGNOSIS — I10 Essential (primary) hypertension: Secondary | ICD-10-CM

## 2020-11-04 DIAGNOSIS — R351 Nocturia: Secondary | ICD-10-CM

## 2020-11-04 DIAGNOSIS — R5383 Other fatigue: Secondary | ICD-10-CM

## 2020-11-04 DIAGNOSIS — Z6834 Body mass index (BMI) 34.0-34.9, adult: Secondary | ICD-10-CM

## 2020-11-04 DIAGNOSIS — Z0000 Encounter for general adult medical examination without abnormal findings: Secondary | ICD-10-CM

## 2020-11-04 DIAGNOSIS — Z1211 Encounter for screening for malignant neoplasm of colon: Secondary | ICD-10-CM

## 2020-11-04 DIAGNOSIS — E782 Mixed hyperlipidemia: Secondary | ICD-10-CM | POA: Diagnosis not present

## 2020-11-04 DIAGNOSIS — N401 Enlarged prostate with lower urinary tract symptoms: Secondary | ICD-10-CM | POA: Diagnosis not present

## 2020-11-04 DIAGNOSIS — E6609 Other obesity due to excess calories: Secondary | ICD-10-CM

## 2020-11-04 MED ORDER — TADALAFIL 5 MG PO TABS: 5.0000 mg | ORAL_TABLET | Freq: Every day | ORAL | 2 refills | Status: DC | PRN

## 2020-11-04 MED ORDER — METOPROLOL SUCCINATE ER 25 MG PO TB24: 25.0000 mg | ORAL_TABLET | Freq: Every day | ORAL | 0 refills | Status: DC

## 2020-11-04 NOTE — Patient Instructions (Addendum)
Recommend routine eye and dental exam Recommend screening colonoscopy with Dr Lyndel Safe Return in am for fasting blood work Continue prescribed medications Begin Cialis 5 mg daily for prostate Heart healthy diet Increase physical activity Decrease caffeine intake  Benign Prostatic Hyperplasia  Benign prostatic hyperplasia (BPH) is an enlarged prostate gland that is caused by the normal aging process and not by cancer. The prostate is a walnut-sized gland that is involved in the production of semen. It is located in front of the rectum and below the bladder. The bladder stores urine and the urethra is the tube that carries the urine out of the body. The prostate may get bigger as a man gets older. An enlarged prostate can press on the urethra. This can make it harder to pass urine. The build-up of urine in the bladder can cause infection. Back pressure and infection may progress to bladder damage and kidney (renal) failure. What are the causes? This condition is part of a normal aging process. However, not all men develop problems from this condition. If the prostate enlarges away from the urethra, urine flow will not be blocked. If it enlarges toward the urethra and compresses it, there will be problems passing urine. What increases the risk? This condition is more likely to develop in men over the age of 25 years. What are the signs or symptoms? Symptoms of this condition include:  Getting up often during the night to urinate.  Needing to urinate frequently during the day.  Difficulty starting urine flow.  Decrease in size and strength of your urine stream.  Leaking (dribbling) after urinating.  Inability to pass urine. This needs immediate treatment.  Inability to completely empty your bladder.  Pain when you pass urine. This is more common if there is also an infection.  Urinary tract infection (UTI). How is this diagnosed? This condition is diagnosed based on your medical  history, a physical exam, and your symptoms. Tests will also be done, such as:  A post-void bladder scan. This measures any amount of urine that may remain in your bladder after you finish urinating.  A digital rectal exam. In a rectal exam, your health care provider checks your prostate by putting a lubricated, gloved finger into your rectum to feel the back of your prostate gland. This exam detects the size of your gland and any abnormal lumps or growths.  An exam of your urine (urinalysis).  A prostate specific antigen (PSA) screening. This is a blood test used to screen for prostate cancer.  An ultrasound. This test uses sound waves to electronically produce a picture of your prostate gland. Your health care provider may refer you to a specialist in kidney and prostate diseases (urologist). How is this treated? Once symptoms begin, your health care provider will monitor your condition (active surveillance or watchful waiting). Treatment for this condition will depend on the severity of your condition. Treatment may include:  Observation and yearly exams. This may be the only treatment needed if your condition and symptoms are mild.  Medicines to relieve your symptoms, including: ? Medicines to shrink the prostate. ? Medicines to relax the muscle of the prostate.  Surgery in severe cases. Surgery may include: ? Prostatectomy. In this procedure, the prostate tissue is removed completely through an open incision or with a laparoscope or robotics. ? Transurethral resection of the prostate (TURP). In this procedure, a tool is inserted through the opening at the tip of the penis (urethra). It is used to cut away  tissue of the inner core of the prostate. The pieces are removed through the same opening of the penis. This removes the blockage. ? Transurethral incision (TUIP). In this procedure, small cuts are made in the prostate. This lessens the prostate's pressure on the  urethra. ? Transurethral microwave thermotherapy (TUMT). This procedure uses microwaves to create heat. The heat destroys and removes a small amount of prostate tissue. ? Transurethral needle ablation (TUNA). This procedure uses radio frequencies to destroy and remove a small amount of prostate tissue. ? Interstitial laser coagulation ([place]). This procedure uses a laser to destroy and remove a small amount of prostate tissue. ? Transurethral electrovaporization (TUVP). This procedure uses electrodes to destroy and remove a small amount of prostate tissue. ? Prostatic urethral lift. This procedure inserts an implant to push the lobes of the prostate away from the urethra. Follow these instructions at home:  Take over-the-counter and prescription medicines only as told by your health care provider.  Monitor your symptoms for any changes. Contact your health care provider with any changes.  Avoid drinking large amounts of liquid before going to bed or out in public.  Avoid or reduce how much caffeine or alcohol you drink.  Give yourself time when you urinate.  Keep all follow-up visits as told by your health care provider. This is important. Contact a health care provider if:  You have unexplained back pain.  Your symptoms do not get better with treatment.  You develop side effects from the medicine you are taking.  Your urine becomes very dark or has a bad smell.  Your lower abdomen becomes distended and you have trouble passing your urine. Get help right away if:  You have a fever or chills.  You suddenly cannot urinate.  You feel lightheaded, or very dizzy, or you faint.  There are large amounts of blood or clots in the urine.  Your urinary problems become hard to manage.  You develop moderate to severe low back or flank pain. The flank is the side of your body between the ribs and the hip. These symptoms may represent a serious problem that is an emergency. Do not wait to  see if the symptoms will go away. Get medical help right away. Call your local emergency services (911 in the [place] Do not drive yourself to the hospital. Summary  Benign prostatic hyperplasia (BPH) is an enlarged prostate that is caused by the normal aging process and not by cancer.  An enlarged prostate can press on the urethra. This can make it hard to pass urine.  This condition is part of a normal aging process and is more likely to develop in men over the age of 54 years.  Get help right away if you suddenly cannot urinate. This information is not intended to replace advice given to you by your health care provider. Make sure you discuss any questions you have with your health care provider. Document Revised: 04/29/2020 Document Reviewed: 04/29/2020 Elsevier Patient Education  2021 [place].  Fatigue If you have fatigue, you feel tired all the time and have a lack of energy or a lack of motivation. Fatigue may make it difficult to start or complete tasks because of exhaustion. In general, occasional or mild fatigue is often a normal response to activity or life. However, long-lasting (chronic) or extreme fatigue may be a symptom of a medical condition. Follow these instructions at home: General instructions  Watch your fatigue for any changes.  Go to bed and  get up at the same time every day.  Avoid fatigue by pacing yourself during the day and getting enough sleep at night.  Maintain a healthy weight. Medicines  Take over-the-counter and prescription medicines only as told by your health care provider.  Take a multivitamin, if told by your health care provider.  Do not use herbal or dietary supplements unless they are approved by your health care provider. Activity  Exercise regularly, as told by your health care provider.  Use or practice techniques to help you relax, such as yoga, tai chi, meditation, or massage therapy.   Eating and drinking  Avoid heavy meals  in the evening.  Eat a well-balanced diet, which includes lean proteins, whole grains, plenty of fruits and vegetables, and low-fat dairy products.  Avoid consuming too much caffeine.  Avoid the use of alcohol.  Drink enough fluid to keep your urine pale yellow.   Lifestyle  Change situations that cause you stress. Try to keep your work and personal schedule in balance.  Do not use any products that contain nicotine or tobacco, such as cigarettes and e-cigarettes. If you need help quitting, ask your health care provider.  Do not use drugs. Contact a health care provider if:  Your fatigue does not get better.  You have a fever.  You suddenly lose or gain weight.  You have headaches.  You have trouble falling asleep or sleeping through the night.  You feel angry, guilty, anxious, or sad.  You are unable to have a bowel movement (constipation).  Your skin is dry.  You have swelling in your legs or another part of your body. Get help right away if:  You feel confused.  Your vision is blurry.  You feel faint or you pass out.  You have a severe headache.  You have severe pain in your abdomen, your back, or the area between your waist and hips (pelvis).  You have chest pain, shortness of breath, or an irregular or fast heartbeat.  You are unable to urinate, or you urinate less than normal.  You have abnormal bleeding, such as bleeding from the rectum, vagina, nose, lungs, or nipples.  You vomit blood.  You have thoughts about hurting yourself or others. If you ever feel like you may hurt yourself or others, or have thoughts about taking your own life, get help right away. You can go to your nearest emergency department or call:  Your local emergency services (911 in the [place]  A suicide crisis helpline, such as the [place] [facility] at 501-662-0455. This is open 24 hours a day. Summary  If you have fatigue, you feel tired all the time and  have a lack of energy or a lack of motivation.  Fatigue may make it difficult to start or complete tasks because of exhaustion.  Long-lasting (chronic) or extreme fatigue may be a symptom of a medical condition.  Exercise regularly, as told by your health care provider.  Change situations that cause you stress. Try to keep your work and personal schedule in balance. This information is not intended to replace advice given to you by your health care provider. Make sure you discuss any questions you have with your health care provider. Document Revised: 03/12/2019 Document Reviewed: 05/16/2017 Elsevier Patient Education  2021 [place].  Hypertension, Adult Hypertension is another name for high blood pressure. High blood pressure forces your heart to work harder to pump blood. This can cause problems over time. There are two  numbers in a blood pressure reading. There is a top number (systolic) over a bottom number (diastolic). It is best to have a blood pressure that is below 120/80. Healthy choices can help lower your blood pressure, or you may need medicine to help lower it. What are the causes? The cause of this condition is not known. Some conditions may be related to high blood pressure. What increases the risk?  Smoking.  Having type 2 diabetes mellitus, high cholesterol, or both.  Not getting enough exercise or physical activity.  Being overweight.  Having too much fat, sugar, calories, or salt (sodium) in your diet.  Drinking too much alcohol.  Having long-term (chronic) kidney disease.  Having a family history of high blood pressure.  Age. Risk increases with age.  Race. You may be at higher risk if you are African American.  Gender. Men are at higher risk than women before age 57. After age 74, women are at higher risk than men.  Having obstructive sleep apnea.  Stress. What are the signs or symptoms?  High blood pressure may not cause symptoms. Very high  blood pressure (hypertensive crisis) may cause: ? Headache. ? Feelings of worry or nervousness (anxiety). ? Shortness of breath. ? Nosebleed. ? A feeling of being sick to your stomach (nausea). ? Throwing up (vomiting). ? Changes in how you see. ? Very bad chest pain. ? Seizures. How is this treated?  This condition is treated by making healthy lifestyle changes, such as: ? Eating healthy foods. ? Exercising more. ? Drinking less alcohol.  Your health care provider may prescribe medicine if lifestyle changes are not enough to get your blood pressure under control, and if: ? Your top number is above 130. ? Your bottom number is above 80.  Your personal target blood pressure may vary. Follow these instructions at home: Eating and drinking  If told, follow the DASH eating plan. To follow this plan: ? Fill one half of your plate at each meal with fruits and vegetables. ? Fill one fourth of your plate at each meal with whole grains. Whole grains include whole-wheat pasta, brown rice, and whole-grain bread. ? Eat or drink low-fat dairy products, such as skim milk or low-fat yogurt. ? Fill one fourth of your plate at each meal with low-fat (lean) proteins. Low-fat proteins include fish, chicken without skin, eggs, beans, and tofu. ? Avoid fatty meat, cured and processed meat, or chicken with skin. ? Avoid pre-made or processed food.  Eat less than 1,500 mg of salt each day.  Do not drink alcohol if: ? Your doctor tells you not to drink. ? You are pregnant, may be pregnant, or are planning to become pregnant.  If you drink alcohol: ? Limit how much you use to:  0-1 drink a day for women.  0-2 drinks a day for men. ? Be aware of how much alcohol is in your drink. In the [place] one drink equals one 12 oz bottle of beer (355 mL), one 5 oz glass of wine (148 mL), or one 1 oz glass of hard liquor (44 mL).   Lifestyle  Work with your doctor to stay at a healthy weight or to lose  weight. Ask your doctor what the best weight is for you.  Get at least 30 minutes of exercise most days of the week. This may include walking, swimming, or biking.  Get at least 30 minutes of exercise that strengthens your muscles (resistance exercise) at least 3 days a  week. This may include lifting weights or doing Pilates.  Do not use any products that contain nicotine or tobacco, such as cigarettes, e-cigarettes, and chewing tobacco. If you need help quitting, ask your doctor.  Check your blood pressure at home as told by your doctor.  Keep all follow-up visits as told by your doctor. This is important.   Medicines  Take over-the-counter and prescription medicines only as told by your doctor. Follow directions carefully.  Do not skip doses of blood pressure medicine. The medicine does not work as well if you skip doses. Skipping doses also puts you at risk for problems.  Ask your doctor about side effects or reactions to medicines that you should watch for. Contact a doctor if you:  Think you are having a reaction to the medicine you are taking.  Have headaches that keep coming back (recurring).  Feel dizzy.  Have swelling in your ankles.  Have trouble with your vision. Get help right away if you:  Get a very bad headache.  Start to feel mixed up (confused).  Feel weak or numb.  Feel faint.  Have very bad pain in your: ? Chest. ? Belly (abdomen).  Throw up more than once.  Have trouble breathing. Summary  Hypertension is another name for high blood pressure.  High blood pressure forces your heart to work harder to pump blood.  For most people, a normal blood pressure is less than 120/80.  Making healthy choices can help lower blood pressure. If your blood pressure does not get lower with healthy choices, you may need to take medicine. This information is not intended to replace advice given to you by your health care provider. Make sure you discuss any  questions you have with your health care provider. Document Revised: 05/01/2018 Document Reviewed: 05/01/2018 Elsevier Patient Education  2021 [place].  [email or web address]">  DASH Eating Plan DASH stands for Dietary Approaches to Stop Hypertension. The DASH eating plan is a healthy eating plan that has been shown to:  Reduce high blood pressure (hypertension).  Reduce your risk for type 2 diabetes, heart disease, and stroke.  Help with weight loss. What are tips for following this plan? Reading food labels  Check food labels for the amount of salt (sodium) per serving. Choose foods with less than 5 percent of the Daily Value of sodium. Generally, foods with less than 300 milligrams (mg) of sodium per serving fit into this eating plan.  To find whole grains, look for the word "whole" as the first word in the ingredient list. Shopping  Buy products labeled as "low-sodium" or "no salt added."  Buy fresh foods. Avoid canned foods and pre-made or frozen meals. Cooking  Avoid adding salt when cooking. Use salt-free seasonings or herbs instead of table salt or sea salt. Check with your health care provider or pharmacist before using salt substitutes.  Do not fry foods. Cook foods using healthy methods such as baking, boiling, grilling, roasting, and broiling instead.  Cook with heart-healthy oils, such as olive, canola, avocado, soybean, or sunflower oil. Meal planning  Eat a balanced diet that includes: ? 4 or more servings of fruits and 4 or more servings of vegetables each day. Try to fill one-half of your plate with fruits and vegetables. ? 6-8 servings of whole grains each day. ? Less than 6 oz (170 g) of lean meat, poultry, or fish each day. A 3-oz (85-g) serving of meat is about the same size as a deck of cards.  One egg equals 1 oz (28 g). ? 2-3 servings of low-fat dairy each day. One serving is 1 cup (237 mL). ? 1 serving of  nuts, seeds, or beans 5 times each week. ? 2-3 servings of heart-healthy fats. Healthy fats called omega-3 fatty acids are found in foods such as walnuts, flaxseeds, fortified milks, and eggs. These fats are also found in cold-water fish, such as sardines, salmon, and mackerel.  Limit how much you eat of: ? Canned or prepackaged foods. ? Food that is high in trans fat, such as some fried foods. ? Food that is high in saturated fat, such as fatty meat. ? Desserts and other sweets, sugary drinks, and other foods with added sugar. ? Full-fat dairy products.  Do not salt foods before eating.  Do not eat more than 4 egg yolks a week.  Try to eat at least 2 vegetarian meals a week.  Eat more home-cooked food and less restaurant, buffet, and fast food.   Lifestyle  When eating at a restaurant, ask that your food be prepared with less salt or no salt, if possible.  If you drink alcohol: ? Limit how much you use to:  0-1 drink a day for women who are not pregnant.  0-2 drinks a day for men. ? Be aware of how much alcohol is in your drink. In the [place] one drink equals one 12 oz bottle of beer (355 mL), one 5 oz glass of wine (148 mL), or one 1 oz glass of hard liquor (44 mL). General information  Avoid eating more than 2,300 mg of salt a day. If you have hypertension, you may need to reduce your sodium intake to 1,500 mg a day.  Work with your health care provider to maintain a healthy body weight or to lose weight. Ask what an ideal weight is for you.  Get at least 30 minutes of exercise that causes your heart to beat faster (aerobic exercise) most days of the week. Activities may include walking, swimming, or biking.  Work with your health care provider or dietitian to adjust your eating plan to your individual calorie needs. What foods should I eat? Fruits All fresh, dried, or frozen fruit. Canned fruit in natural juice (without added sugar). Vegetables Fresh or frozen vegetables  (raw, steamed, roasted, or grilled). Low-sodium or reduced-sodium tomato and vegetable juice. Low-sodium or reduced-sodium tomato sauce and tomato paste. Low-sodium or reduced-sodium canned vegetables. Grains Whole-grain or whole-wheat bread. Whole-grain or whole-wheat pasta. Brown rice. [place]. Bulgur. Whole-grain and low-sodium cereals. Pita bread. Low-fat, low-sodium crackers. Whole-wheat flour tortillas. Meats and other proteins Skinless chicken or [place]. Ground chicken or [place]. Pork with fat trimmed off. Fish and seafood. Egg whites. Dried beans, peas, or lentils. Unsalted nuts, nut butters, and seeds. Unsalted canned beans. Lean cuts of beef with fat trimmed off. Low-sodium, lean precooked or cured meat, such as sausages or meat loaves. Dairy Low-fat (1%) or fat-free (skim) milk. Reduced-fat, low-fat, or fat-free cheeses. Nonfat, low-sodium ricotta or cottage cheese. Low-fat or nonfat yogurt. Low-fat, low-sodium cheese. Fats and oils Soft margarine without trans fats. Vegetable oil. Reduced-fat, low-fat, or light mayonnaise and salad dressings (reduced-sodium). Canola, safflower, olive, avocado, soybean, and sunflower oils. Avocado. Seasonings and condiments Herbs. Spices. Seasoning mixes without salt. Other foods Unsalted popcorn and pretzels. Fat-free sweets. The items listed above may not be a complete list of foods and beverages you can eat. Contact a dietitian for more information. What foods should I avoid? Fruits Canned  fruit in a light or heavy syrup. Fried fruit. Fruit in cream or butter sauce. Vegetables Creamed or fried vegetables. Vegetables in a cheese sauce. Regular canned vegetables (not low-sodium or reduced-sodium). Regular canned tomato sauce and paste (not low-sodium or reduced-sodium). Regular tomato and vegetable juice (not low-sodium or reduced-sodium). Angie Fava. Olives. Grains Baked goods made with fat, such as croissants, muffins, or some breads. Dry pasta  or rice meal packs. Meats and other proteins Fatty cuts of meat. Ribs. Fried meat. Berniece Salines. Bologna, salami, and other precooked or cured meats, such as sausages or meat loaves. Fat from the back of a pig (fatback). Bratwurst. Salted nuts and seeds. Canned beans with added salt. Canned or smoked fish. Whole eggs or egg yolks. Chicken or [place] with skin. Dairy Whole or 2% milk, cream, and half-and-half. Whole or full-fat cream cheese. Whole-fat or sweetened yogurt. Full-fat cheese. Nondairy creamers. Whipped toppings. Processed cheese and cheese spreads. Fats and oils Butter. Stick margarine. Lard. Shortening. Ghee. Bacon fat. Tropical oils, such as coconut, palm kernel, or palm oil. Seasonings and condiments Onion salt, garlic salt, seasoned salt, table salt, and sea salt. Worcestershire sauce. Tartar sauce. Barbecue sauce. Teriyaki sauce. Soy sauce, including reduced-sodium. Steak sauce. Canned and packaged gravies. Fish sauce. Oyster sauce. Cocktail sauce. Store-bought horseradish. Ketchup. Mustard. Meat flavorings and tenderizers. Bouillon cubes. Hot sauces. Pre-made or packaged marinades. Pre-made or packaged taco seasonings. Relishes. Regular salad dressings. Other foods Salted popcorn and pretzels. The items listed above may not be a complete list of foods and beverages you should avoid. Contact a dietitian for more information. Where to find more information  National Heart, Lung, and Blood Institute: https://wilson-eaton.com/  American Heart Association: www.heart.org  Academy of Nutrition and Dietetics: www.eatright.[place]: www.kidney.org Summary  The DASH eating plan is a healthy eating plan that has been shown to reduce high blood pressure (hypertension). It may also reduce your risk for type 2 diabetes, heart disease, and stroke.  When on the DASH eating plan, aim to eat more fresh fruits and vegetables, whole grains, lean proteins, low-fat dairy, and  heart-healthy fats.  With the DASH eating plan, you should limit salt (sodium) intake to 2,300 mg a day. If you have hypertension, you may need to reduce your sodium intake to 1,500 mg a day.  Work with your health care provider or dietitian to adjust your eating plan to your individual calorie needs. This information is not intended to replace advice given to you by your health care provider. Make sure you discuss any questions you have with your health care provider. Document Revised: 07/25/2019 Document Reviewed: 07/25/2019 Elsevier Patient Education  2021 [place].  Erectile Dysfunction Erectile dysfunction (ED) is the inability to get or keep an erection in order to have sexual intercourse. ED is considered a symptom of an underlying disorder and not considered a disease. Erectile dysfunction may include:  Inability to get an erection.  Lack of enough hardness of the erection to allow penetration.  Loss of the erection before sex is finished. What are the causes? This condition may be caused by:  Certain medicines, such as: ? Pain relievers. ? Antihistamines. ? Antidepressants. ? Blood pressure medicines. ? Water pills (diuretics). ? Ulcer medicines. ? Muscle relaxants. ? Drugs.  Excessive drinking.  Psychological causes, such as: ? Anxiety. ? Depression. ? Sadness. ? Exhaustion. ? Performance fear. ? Stress.  Physical causes, such as: ? Artery problems. This may include diabetes, smoking, liver disease, or atherosclerosis. ? High  blood pressure. ? Hormonal problems, such as low testosterone. ? Obesity. ? Nerve problems. This may include back or pelvic injuries, diabetes mellitus, multiple sclerosis, or Parkinson's disease. What are the signs or symptoms? Symptoms of this condition include:  Inability to get an erection.  Lack of enough hardness of the erection to allow penetration.  Loss of the erection before sex is finished.  Normal erections at  some times, but with frequent unsatisfactory episodes.  Low sexual satisfaction in either partner due to erection problems.  A curved penis occurring with erection. The curve may cause pain or the penis may be too curved to allow for intercourse.  Never having nighttime erections. How is this diagnosed? This condition is often diagnosed by:  Performing a physical exam to find other diseases or specific problems with the penis.  Asking you detailed questions about the problem.  Performing blood tests to check for diabetes mellitus or to measure hormone levels.  Performing other tests to check for underlying health conditions.  Performing an ultrasound exam to check for scarring.  Performing a test to check blood flow to the penis.  Doing a sleep study at home to measure nighttime erections. How is this treated? This condition may be treated by:  Medicine taken by mouth to help you achieve an erection (oral medicine).  Hormone replacement therapy to replace low testosterone levels.  Medicine that is injected into the penis. Your health care provider may instruct you how to give yourself these injections at home.  Vacuum pump. This is a pump with a ring on it. The pump and ring are placed on the penis and used to create pressure that helps the penis become erect.  Penile implant surgery. In this procedure, you may receive: ? An inflatable implant. This consists of cylinders, a pump, and a reservoir. The cylinders can be inflated with a fluid that helps to create an erection, and they can be deflated after intercourse. ? A semi-rigid implant. This consists of two silicone rubber rods. The rods provide some rigidity. They are also flexible, so the penis can both curve downward in its normal position and become straight for sexual intercourse.  Blood vessel surgery, to improve blood flow to the penis. During this procedure, a blood vessel from a different part of the body is placed  into the penis to allow blood to flow around (bypass) damaged or blocked blood vessels.  Lifestyle changes, such as exercising more, losing weight, and quitting smoking. Follow these instructions at home: Medicines  Take over-the-counter and prescription medicines only as told by your health care provider. Do not increase the dosage without first discussing it with your health care provider.  If you are using self-injections, perform injections as directed by your health care provider. Make sure to avoid any veins that are on the surface of the penis. After giving an injection, apply pressure to the injection site for 5 minutes.   General instructions  Exercise regularly, as directed by your health care provider. Work with your health care provider to lose weight, if needed.  Do not use any products that contain nicotine or tobacco, such as cigarettes and e-cigarettes. If you need help quitting, ask your health care provider.  Before using a vacuum pump, read the instructions that come with the pump and discuss any questions with your health care provider.  Keep all follow-up visits as told by your health care provider. This is important. Contact a health care provider if:  You feel  nauseous.  You vomit. Get help right away if:  You are taking oral or injectable medicines and you have an erection that lasts longer than 4 hours. If your health care provider is unavailable, go to the nearest emergency room for evaluation. An erection that lasts much longer than 4 hours can result in permanent damage to your penis.  You have severe pain in your groin or abdomen.  You develop redness or severe swelling of your penis.  You have redness spreading up into your groin or lower abdomen.  You are unable to urinate.  You experience chest pain or a rapid heart beat (palpitations) after taking oral medicines. Summary  Erectile dysfunction (ED) is the inability to get or keep an erection  during sexual intercourse. This problem can usually be treated successfully.  This condition is diagnosed based on a physical exam, your symptoms, and tests to determine the cause. Treatment varies depending on the cause and may include medicines, hormone therapy, surgery, or a vacuum pump.  You may need follow-up visits to make sure that you are using your medicines or devices correctly.  Get help right away if you are taking or injecting medicines and you have an erection that lasts longer than 4 hours. This information is not intended to replace advice given to you by your health care provider. Make sure you discuss any questions you have with your health care provider. Document Revised: 05/07/2020 Document Reviewed: 05/07/2020 Elsevier Patient Education  2021 [place].  Tadalafil tablets (Cialis) What is this medicine? TADALAFIL (tah DA la fil) is used to treat erection problems in men. It is also used for enlargement of the prostate gland in men, a condition called benign prostatic hyperplasia or BPH. This medicine improves urine flow and reduces BPH symptoms. This medicine can also treat both erection problems and BPH when they occur together. This medicine may be used for other purposes; ask your health care provider or pharmacist if you have questions. COMMON BRAND NAME(S): Kathaleen Bury, Cialis What should I tell my health care provider before I take this medicine? They need to know if you have any of these conditions:  bleeding disorders  eye or vision problems, including a rare inherited eye disease called retinitis pigmentosa  anatomical deformation of the penis, Peyronie's disease, or history of priapism (painful and prolonged erection)  heart disease, angina, a history of heart attack, irregular heart beats, or other heart problems  high or low blood pressure  history of blood diseases, like sickle cell anemia or leukemia  history of stomach bleeding  kidney  disease  liver disease  stroke  an unusual or allergic reaction to tadalafil, other medicines, foods, dyes, or preservatives  pregnant or trying to get pregnant  breast-feeding How should I use this medicine? Take this medicine by mouth with a glass of water. Follow the directions on the prescription label. You may take this medicine with or without meals. When this medicine is used for erection problems, your doctor may prescribe it to be taken once daily or as needed. If you are taking the medicine as needed, you may be able to have sexual activity 30 minutes after taking it and for up to 36 hours after taking it. Whether you are taking the medicine as needed or once daily, you should not take more than one dose per day. If you are taking this medicine for symptoms of benign prostatic hyperplasia (BPH) or to treat both BPH and an erection problem, take the  dose once daily at about the same time each day. Do not take your medicine more often than directed. Talk to your pediatrician regarding the use of this medicine in children. Special care may be needed. Overdosage: If you think you have taken too much of this medicine contact a poison control center or emergency room at once. NOTE: This medicine is only for you. Do not share this medicine with others. What if I miss a dose? If you are taking this medicine as needed for erection problems, this does not apply. If you miss a dose while taking this medicine once daily for an erection problem, benign prostatic hyperplasia, or both, take it as soon as you remember, but do not take more than one dose per day. What may interact with this medicine? Do not take this medicine with any of the following medications:  nitrates like amyl nitrite, isosorbide dinitrate, isosorbide mononitrate, nitroglycerin  other medicines for erectile dysfunction like avanafil, sildenafil, vardenafil  other tadalafil products ([employer]circa)  riociguat This medicine may  also interact with the following medications:  certain drugs for high blood pressure  certain drugs for the treatment of HIV infection or AIDS  certain drugs used for fungal or yeast infections, like fluconazole, itraconazole, ketoconazole, and voriconazole  certain drugs used for seizures like carbamazepine, phenytoin, and phenobarbital  grapefruit juice  macrolide antibiotics like clarithromycin, erythromycin, troleandomycin  medicines for prostate problems  rifabutin, rifampin or rifapentine This list may not describe all possible interactions. Give your health care provider a list of all the medicines, herbs, non-prescription drugs, or dietary supplements you use. Also tell them if you smoke, drink alcohol, or use illegal drugs. Some items may interact with your medicine. What should I watch for while using this medicine? If you notice any changes in your vision while taking this drug, call your doctor or health care professional as soon as possible. Stop using this medicine and call your health care provider right away if you have a loss of sight in one or both eyes. Contact your doctor or health care professional right away if the erection lasts longer than 4 hours or if it becomes painful. This may be a sign of serious problem and must be treated right away to prevent permanent damage. If you experience symptoms of nausea, dizziness, chest pain or arm pain upon initiation of sexual activity after taking this medicine, you should refrain from further activity and call your doctor or health care professional as soon as possible. Do not drink alcohol to excess (examples, 5 glasses of wine or 5 shots of whiskey) when taking this medicine. When taken in excess, alcohol can increase your chances of getting a headache or getting dizzy, increasing your heart rate or lowering your blood pressure. Using this medicine does not protect you or your partner against HIV infection (the virus that  causes AIDS) or other sexually transmitted diseases. What side effects may I notice from receiving this medicine? Side effects that you should report to your doctor or health care professional as soon as possible:  allergic reactions like skin rash, itching or hives, swelling of the face, lips, or tongue  breathing problems  changes in hearing  changes in vision  chest pain  fast, irregular heartbeat  prolonged or painful erection  seizures Side effects that usually do not require medical attention (report to your doctor or health care professional if they continue or are bothersome):  back pain  dizziness  flushing  headache  indigestion  muscle aches  nausea  stuffy or runny nose This list may not describe all possible side effects. Call your doctor for medical advice about side effects. You may report side effects to FDA at 1-800-FDA-1088. Where should I keep my medicine? Keep out of the reach of children. Store at room temperature between 15 and 30 degrees C (59 and 86 degrees F). Throw away any unused medicine after the expiration date. NOTE: This sheet is a summary. It may not cover all possible information. If you have questions about this medicine, talk to your doctor, pharmacist, or health care provider.  2021 Elsevier/Gold Standard (2014-01-09 13:15:49)  Testosterone Test Why am I having this test? Testosterone is a hormone made by the adrenal glands in the abdomen in both males and females. In males, it is also made by the testicles. Starting at puberty, testosterone stimulates the development of secondary sex characteristics in males. This includes a deeper voice, muscle and body hair growth, and penis enlargement. In females, testosterone is also produced in the ovaries. The male body converts testosterone into estradiol, the main male sex hormone. An abnormal level of testosterone can cause health problems in both males and females. You may have this  test if your health care provider suspects that an abnormal testosterone level is causing or contributing to other health problems. In males, an abnormally low testosterone level can cause:  Inability to have children.  Trouble getting or maintaining an erection.  Delayed puberty. In females, an abnormally high testosterone level can cause:  Infertility.  Polycystic ovary syndrome.  Development of masculine features. What is being tested? This test measures the amount of total testosterone in your blood. What kind of sample is taken? A blood sample is required for this test. It is usually collected by inserting a needle into a blood vessel. The sample is most often collected in the morning because that is when testosterone is usually the highest.   How do I prepare for this test? Follow instructions from your health care provider about changing or stopping your regular medicines. Tell a health care provider about:  Any allergies you have.  All medicines you are taking, including vitamins, herbs, eye drops, creams, and over-the-counter medicines.  Any blood disorders you have.  Any surgeries you have had.  Any medical conditions you have.  Whether you are pregnant or may be pregnant. How are the results reported? Your test results will be reported as a value that indicates how much testosterone is in your blood. This will be given as nanograms of testosterone per deciliter of blood (ng/dL). Your health care provider will compare your test results to normal ranges that were established after testing a large group of people (reference ranges). Reference ranges may vary among labs and hospitals. For this test, common reference ranges for total testosterone are:  Male: ? 7 months to 61 years old: less than 30 ng/dL. ? 36-40 years old: less than 300 ng/dL. ? 3-11 years old: 170-540 ng/dL. ? 26-67 years old: 250-910 ng/dL. ? 20 years and older: 280-1,080 ng/dL.  Male: ? 7  months to 61 years old: less than 30 ng/dL. ? 10-63 years old: less than 40 ng/dL. ? 40-16 years old: less than 60 ng/dL. ? 66-51 years old: less than 70 ng/dL. ? 20 years and older: less than 70 ng/dL. What do the results mean? A result that is within your reference range means that you have a normal amount of testosterone in your blood. In males:  A high testosterone level may mean that you: ? Have certain types of tumors. ? Have an overactive thyroid gland (hyperthyroidism). ? Currently use anabolic steroids or used anabolic steroids in the past. ? Have an inherited disorder that affects the adrenal glands (congenital adrenal hyperplasia). ? Are starting puberty early (precocious puberty).  A low testosterone level may mean that you: ? Have certain genetic diseases. ? Have had certain viral infections, such as mumps. ? Have a condition that affects the pituitary gland. ? Have injured your testicles. In females:  A high testosterone level may mean that you have: ? Certain types of tumors, such as ovarian or adrenal gland tumors. ? An inherited disorder that affects certain cells in the adrenal glands (congenital adrenal hyperplasia). ? Polycystic ovary syndrome.  A low testosterone level usually will not cause health problems. Talk with your health care provider about what your results mean. Questions to ask your health care provider Ask your health care provider, or the department that is doing the test:  When will my results be ready?  How will I get my results?  What are my treatment options?  What other tests do I need?  What are my next steps? Summary  Testosterone is a hormone made by the adrenal glands in the abdomen in both males and females. In males, it is also made by the testicles. Starting at puberty, testosterone stimulates the development of secondary sex characteristics in males.  In females, testosterone is also produced in the ovaries. The male body  converts testosterone into estradiol, the main male sex hormone.  An abnormal level of testosterone can cause health issues in both males and females. You may have this test if your health care provider suspects that an abnormal testosterone level is causing or contributing to other health problems. This information is not intended to replace advice given to you by your health care provider. Make sure you discuss any questions you have with your health care provider. Document Revised: 05/24/2020 Document Reviewed: 05/24/2020 Elsevier Patient Education  [place].

## 2020-11-04 NOTE — Progress Notes (Signed)
New Patient Office Visit  Subjective:  Patient ID: Gregory Marks, male    DOB: 06/27/1961  Age: 60 y.o. MRN: [id number]  CC:  Chief Complaint  Patient presents with  . New to establish    Hypertension follow up on medications.    HPI Gregory "Gregory Leriche" Marks is a 60 year old Caucasian male that presents to establish care. He has a past medical history of hypertension, hyperlipidemia, GERD, and allergic rhinitis. He states he was previously seen by [facility] and [facility]. Medical records requested from previous providers. .He tells me he is overdue for routine dental and eye exams. He states he had a screening colonoscopy with Dr Chales Abrahams in 2017 that revealed colon polyps and diverticulosis. He was due to follow-up in 2020 but forgot. Gregory Leriche said he has trouble with hemorrhoids  Intermittently that are treated with OTC remedies. He has agreed to referral to Dr Chales Abrahams for screening colonoscopy. Gregory Leriche has obtained two COVID-19 vaccination. He has declined seasonal flu vaccine today.   Hypertension Gregory Leriche has a history of hypertension for several years. Current treatment includes Lotrel 10-40 daily and Metoprolol 25 mg daily. He tells me he has been out of Metoprolol 25 mg for approximately 1-week. Pulse today is 98 BPM. Pt teaching provided concerning suddenly discontinuation of beta blocker and rebound tachycardia. Refill sent to [facility] pharmacy. Gregory Leriche denies chest pain, shortness of breath, dizziness, or headache. He tells me he tries to eat a heart healthy diet but is not currently exercising regularly due to time constraints. 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     Hyperlipidemia Gregory Leriche tells me he has been diagnosed with hyperlipidemia by past providers. He states he was intolerant to statin therapy due to myalgias and arthralgias. He is unable to recall name of statin he was prescribed. He discontinued medication on  his own due to side effects.He is to return in am for fasting labs to assess lipids.    Nocturia Gregory Leriche tells me that he is experiencing nocturia 4-5 times a night. He states he is experiencing slow urinary stream, straining to void, and hesitancy. He denies history of prostate problems.He admits to drinking numerous Diet Pepsi's throughout the day and evening along with "some" water. States he drinks minimal caffeine.   GERD Gregory Leriche tells me that he has a history of GERD for several years. Symptoms include burning in epigastric area and food regurgitation.Treatment includes Prilosec OTC 20 mg daily. He denies dysphagia, chest pain, chronic cough, or hoarseness.    Allergic Rhinitis Gregory Leriche has a history of allergic rhinitis for several years. Symptoms include watery eyes, nasal congestion, rhinorrhea, and "itchy" throat. Symptoms are well-controlled with Claritin 10 mg daily. Denies chronic sinusitis,asthma, or eczema.   Fatigue Gregory Leriche tells me that he has experienced increased fatigue over the past year. States he works long hours and has little to no energy in the evening after dinner. States he has gained weight in recent years. Pt speculates frequent nocturia is contributing to his fatigue.   Past Medical History:  Diagnosis Date  . Hypertension    does not see a cardiologist  . Osteoarthritis     Past Surgical History:  Procedure Laterality Date  . APPENDECTOMY     age 26  . KNEE ARTHROPLASTY Right 07/28/2013   Procedure: COMPUTER ASSISTED TOTAL KNEE ARTHROPLASTY;  Surgeon: Eldred Manges, MD;  Location: [facility] OR;  Service: Orthopedics;  Laterality: Right;  Right  Total Knee Arthroplasty  . KNEE SURGERY  1976, 1992   right     Family History  Problem Relation Age of Onset  . Ovarian cancer Mother   . Alzheimer's disease Father     Social History   Socioeconomic History  . Marital status: Married    Spouse name: Not on file  . Number of children: 3  . Years of education: Not on file   . Highest education level: Not on file  Occupational History  . Not on file  Tobacco Use  . Smoking status: Never Smoker  . Smokeless tobacco: Never Used  Vaping Use  . Vaping Use: Never used  Substance and Sexual Activity  . Alcohol use: Yes    Comment: reports beer every now and then  . Drug use: No  . Sexual activity: Not on file  Other Topics Concern  . Not on file  Social History Narrative  . Not on file   Social Determinants of Health   Financial Resource Strain: Not on file  Food Insecurity: Not on file  Transportation Needs: Not on file  Physical Activity: Not on file  Stress: Not on file  Social Connections: Not on file  Intimate Partner Violence: Not on file    ROS Review of Systems  Constitutional: Positive for fatigue. Negative for appetite change and unexpected weight change.  HENT: Negative for congestion, ear pain, rhinorrhea, sinus pressure, sinus pain and tinnitus.   Eyes: Negative for pain.  Respiratory: Negative for cough and shortness of breath.   Cardiovascular: Negative for chest pain, palpitations and leg swelling.  Gastrointestinal: Negative for abdominal pain, constipation, diarrhea, nausea and vomiting.       GERD-controlled with Prilosec 20 mg daily  Endocrine: Positive for polydipsia. Negative for cold intolerance, heat intolerance and polyphagia.  Genitourinary: Positive for difficulty urinating (hesitancy) and frequency. Negative for decreased urine volume, dysuria, hematuria and urgency.  Musculoskeletal: Negative for arthralgias, back pain, joint swelling and myalgias.  Skin: Negative for rash.  Allergic/Immunologic: Positive for environmental allergies (controlled with Claritin daily).  Neurological: Negative for dizziness, syncope and headaches.  Hematological: Negative for adenopathy.  Psychiatric/Behavioral: Positive for sleep disturbance (related to nocturia). Negative for decreased concentration. The patient is not nervous/anxious.      Objective:   Today's Vitals: BP 132/78 (BP Location: Left Arm, Patient Position: Sitting)   Pulse 98   Temp 97.8 F (36.6 C) (Temporal)   Ht 5\' 9"  (1.753 m)   Wt 233 lb (105.7 kg)   SpO2 97%   BMI 34.41 kg/m   Physical Exam Vitals reviewed.  Constitutional:      Appearance: Normal appearance.  HENT:     Head: Normocephalic.     Right Ear: There is impacted cerumen.     Left Ear: There is impacted cerumen.     Nose: Nose normal.     Mouth/Throat:     Mouth: Mucous membranes are moist.  Eyes:     Pupils: Pupils are equal, round, and reactive to light.  Cardiovascular:     Rate and Rhythm: Normal rate and regular rhythm.     Pulses: Normal pulses.     Heart sounds: Normal heart sounds.  Pulmonary:     Effort: Pulmonary effort is normal.     Breath sounds: Normal breath sounds.  Abdominal:     General: Bowel sounds are normal.     Palpations: Abdomen is soft.  Musculoskeletal:        General: Normal range  of motion.     Cervical back: Neck supple.  Skin:    General: Skin is warm and dry.     Capillary Refill: Capillary refill takes less than 2 seconds.  Neurological:     General: No focal deficit present.     Mental Status: He is alert and oriented to person, place, and time.  Psychiatric:        Mood and Affect: Mood normal.        Behavior: Behavior normal.     Assessment & Plan:    1. Essential hypertension-well controlled - CBC with Differential/Platelet - Comprehensive metabolic panel - TSH -Continue Lotrel 10-40 daily -Continue Metoprolol 25 mg daily -Heart healthy diet -Increase physical activity   2. Gastroesophageal reflux disease without esophagitis-well controlled -Continue Prilosec 20 mg daily -avoid food triggers -avoid lying down immediately after meals  3. BPH associated with nocturia - tadalafil (CIALIS) 5 MG tablet; Take 1 tablet (5 mg total) by mouth daily as needed for erectile dysfunction.  Dispense: 30 tablet; Refill: 2  4.  Mixed hyperlipidemia - Lipid Panel  5. Fatigue, unspecified type - Testosterone,Free and Total - Vitamin D, 25-hydroxy  6. Encounter for screening colonoscopy - Ambulatory referral to Gastroenterology  7. Encounter for medical examination to establish care  8. Class 1 obesity due to excess calories with serious comorbidity and body mass index (BMI) of 34.0 to 34.9 in adult -heart healthy diet  -increase physical activity  9. BMI 34.0-34.9,adult -heart healthy diet -increase physical activity   Outpatient Encounter Medications as of 11/04/2020  Medication Sig  . amLODipine-benazepril (LOTREL) 10-40 MG capsule Take 1 capsule by mouth daily.  . metoprolol succinate (TOPROL-XL) 25 MG 24 hr tablet Take 25 mg by mouth daily.  . [DISCONTINUED] amLODipine-benazepril (LOTREL) 10-20 MG per capsule Take 1 capsule by mouth daily.  . [DISCONTINUED] aspirin EC 325 MG tablet Take 1 tablet (325 mg total) by mouth daily.  . [DISCONTINUED] methocarbamol (ROBAXIN) 500 MG tablet Take 1 tablet (500 mg total) by mouth every 6 (six) hours as needed for muscle spasms (spasm).  . [DISCONTINUED] oxyCODONE-acetaminophen (ROXICET) 5-325 MG per tablet Take 1-2 tablets by mouth every 4 (four) hours as needed for severe pain.   Recommend routine eye and dental exam Recommend screening colonoscopy with Dr Chales Abrahams Return in am for fasting blood work Continue prescribed medications Begin Cialis 5 mg daily for prostate Heart healthy diet Increase physical activity Decrease caffeine intake   Follow-up: 19-months fasting   Janie Morning, NP

## 2020-11-05 ENCOUNTER — Other Ambulatory Visit: Payer: Self-pay | Admitting: Nurse Practitioner

## 2020-11-05 ENCOUNTER — Ambulatory Visit: Payer: BC Managed Care – PPO

## 2020-11-05 DIAGNOSIS — R5383 Other fatigue: Secondary | ICD-10-CM | POA: Diagnosis not present

## 2020-11-05 DIAGNOSIS — E782 Mixed hyperlipidemia: Secondary | ICD-10-CM | POA: Diagnosis not present

## 2020-11-05 DIAGNOSIS — I10 Essential (primary) hypertension: Secondary | ICD-10-CM | POA: Diagnosis not present

## 2020-11-05 MED ORDER — AMLODIPINE BESY-BENAZEPRIL HCL 10-40 MG PO CAPS: 1.0000 | ORAL_CAPSULE | Freq: Every day | ORAL | 0 refills | Status: DC

## 2020-11-12 LAB — CBC WITH DIFFERENTIAL/PLATELET
Basophils Absolute: 0.1 10*3/uL (ref 0.0–0.2)
Basos: 1 %
EOS (ABSOLUTE): 0.3 10*3/uL (ref 0.0–0.4)
Eos: 3 %
Hematocrit: 43.5 % (ref 37.5–51.0)
Hemoglobin: 15.4 g/dL (ref 13.0–17.7)
Immature Grans (Abs): 0.1 10*3/uL (ref 0.0–0.1)
Immature Granulocytes: 1 %
Lymphocytes Absolute: 3.3 10*3/uL — ABNORMAL HIGH (ref 0.7–3.1)
Lymphs: 34 %
MCH: 32 pg (ref 26.6–33.0)
MCHC: 35.4 g/dL (ref 31.5–35.7)
MCV: 90 fL (ref 79–97)
Monocytes Absolute: 0.9 10*3/uL (ref 0.1–0.9)
Monocytes: 10 %
Neutrophils Absolute: 5 10*3/uL (ref 1.4–7.0)
Neutrophils: 51 %
Platelets: 227 10*3/uL (ref 150–450)
RBC: 4.82 x10E6/uL (ref 4.14–5.80)
RDW: 12.5 % (ref 11.6–15.4)
WBC: 9.7 10*3/uL (ref 3.4–10.8)

## 2020-11-12 LAB — COMPREHENSIVE METABOLIC PANEL
ALT: 45 IU/L — ABNORMAL HIGH (ref 0–44)
AST: 23 IU/L (ref 0–40)
Albumin/Globulin Ratio: 1.4 (ref 1.2–2.2)
Albumin: 4.3 g/dL (ref 3.8–4.9)
Alkaline Phosphatase: 105 IU/L (ref 44–121)
BUN/Creatinine Ratio: 12 (ref 10–24)
BUN: 15 mg/dL (ref 8–27)
Bilirubin Total: 0.4 mg/dL (ref 0.0–1.2)
CO2: 24 mmol/L (ref 20–29)
Calcium: 9.3 mg/dL (ref 8.6–10.2)
Chloride: 98 mmol/L (ref 96–106)
Creatinine, Ser: 1.27 mg/dL (ref 0.76–1.27)
Globulin, Total: 3 g/dL (ref 1.5–4.5)
Glucose: 138 mg/dL — ABNORMAL HIGH (ref 65–99)
Potassium: 3.7 mmol/L (ref 3.5–5.2)
Sodium: 139 mmol/L (ref 134–144)
Total Protein: 7.3 g/dL (ref 6.0–8.5)
eGFR: 65 mL/min/{1.73_m2} (ref >59)

## 2020-11-12 LAB — TESTOSTERONE,FREE AND TOTAL
Testosterone, Free: 6.1 pg/mL — ABNORMAL LOW (ref 6.6–18.1)
Testosterone: 449 ng/dL (ref 264–916)

## 2020-11-12 LAB — LIPID PANEL
Chol/HDL Ratio: 5.2 ratio — ABNORMAL HIGH (ref 0.0–5.0)
Cholesterol, Total: 209 mg/dL — ABNORMAL HIGH (ref 100–199)
HDL: 40 mg/dL (ref >39)
LDL Chol Calc (NIH): 147 mg/dL — ABNORMAL HIGH (ref 0–99)
Triglycerides: 123 mg/dL (ref 0–149)
VLDL Cholesterol Cal: 22 mg/dL (ref 5–40)

## 2020-11-12 LAB — CARDIOVASCULAR RISK ASSESSMENT

## 2020-11-12 LAB — VITAMIN D 25 HYDROXY (VIT D DEFICIENCY, FRACTURES): Vit D, 25-Hydroxy: 39.4 ng/mL (ref 30.0–100.0)

## 2020-11-12 LAB — TSH: TSH: 0.498 u[IU]/mL (ref 0.450–4.500)

## 2020-11-15 ENCOUNTER — Other Ambulatory Visit: Payer: Self-pay

## 2020-11-15 MED ORDER — ROSUVASTATIN CALCIUM 10 MG PO TABS: 10.0000 mg | ORAL_TABLET | Freq: Every day | ORAL | 0 refills | Status: DC

## 2021-02-02 ENCOUNTER — Other Ambulatory Visit: Payer: Self-pay

## 2021-02-02 DIAGNOSIS — I10 Essential (primary) hypertension: Secondary | ICD-10-CM

## 2021-02-02 MED ORDER — METOPROLOL SUCCINATE ER 25 MG PO TB24: 25.0000 mg | ORAL_TABLET | Freq: Every day | ORAL | 0 refills | Status: DC

## 2021-02-02 MED ORDER — AMLODIPINE BESY-BENAZEPRIL HCL 10-40 MG PO CAPS: 1.0000 | ORAL_CAPSULE | Freq: Every day | ORAL | 0 refills | Status: DC

## 2021-02-05 DIAGNOSIS — S61239A Puncture wound without foreign body of unspecified finger without damage to nail, initial encounter: Secondary | ICD-10-CM | POA: Diagnosis not present

## 2021-02-14 NOTE — Progress Notes (Signed)
Established Patient Office Visit  Subjective:  Patient ID: Gregory Marks, male    DOB: June 19, 1961  Age: 60 y.o. MRN: [id number]  CC: Hypertension   HPI Irish Piech presents for follow-up of hypertension, hyperlipidemia, prediabetes, and GERD. He has an upcoming screening colonoscopy 04/22/21. He states he discontinued Crestor 10 mg due to myalgias and arthralgias. He declined alternative statin therapy and immunizations today. Elta Guadeloupe is past due for routine eye exam but states he will schedule one in the near future. He tells me that he has experienced polydipsia and nocturia. Denies previous prostate of urinary symptoms.   He was last seen for hypertension 3 months ago.  BP at that visit was 132/78. Management since that visit includes Lotrel and Metoprolol.  He reports excellent compliance with treatment. He is not having side effects.  He is following a Low Sodium diet. He is not exercising. He does not smoke.  Use of agents associated with hypertension: none.   Outside blood pressures are not being checked. Symptoms: No chest pain No chest pressure  No palpitations No syncope  No dyspnea No orthopnea  No paroxysmal nocturnal dyspnea No lower extremity edema   Pertinent labs: Lab Results  Component Value Date   CHOL 209 (H) 11/05/2020   HDL 40 11/05/2020   LDLCALC 147 (H) 11/05/2020   TRIG 123 11/05/2020   CHOLHDL 5.2 (H) 11/05/2020   Lab Results  Component Value Date   NA 139 11/05/2020   K 3.7 11/05/2020   CREATININE 1.27 11/05/2020   GFRNONAA >90 07/29/2013   GFRAA >90 07/29/2013   GLUCOSE 138 (H) 11/05/2020     The 10-year ASCVD risk score Mikey Bussing DC Jr., et al., 2013) is: 20.9%    Lipid/Cholesterol, Follow-up  Last lipid panel Other pertinent labs  Lab Results  Component Value Date   CHOL 209 (H) 11/05/2020   HDL 40 11/05/2020   LDLCALC 147 (H) 11/05/2020   TRIG 123 11/05/2020   CHOLHDL 5.2 (H) 11/05/2020   Lab Results  Component Value Date   ALT 45  (H) 11/05/2020   AST 23 11/05/2020   PLT 227 11/05/2020   TSH 0.498 11/05/2020     He was last seen for this 3 months ago.  Management since that visit includes diet, stopped Crestor 10 mg due to myalgias and arthralgias.  He reports good compliance with treatment. He is not having side effects.   Symptoms: No chest pain No chest pressure/discomfort  No dyspnea No lower extremity edema  No numbness or tingling of extremity No orthopnea  No palpitations No paroxysmal nocturnal dyspnea  No speech difficulty No syncope   Current diet: in general, a "healthy" diet   Current exercise: none  The 10-year ASCVD risk score Mikey Bussing DC Jr., et al., 2013) is: 20.9%   Prediabetes, Follow-up  Lab Results  Component Value Date   GLUCOSE 138 (H) 11/05/2020   GLUCOSE 138 (H) 07/29/2013   GLUCOSE 101 (H) 07/18/2013    Last seen for for this3 months ago.  Management since that visit includes diet controlled. Current symptoms include none and have been unchanged.  Prior visit with dietician: no Current diet: in general, a "healthy" diet   Current exercise: none  Pertinent Labs:    Component Value Date/Time   CHOL 209 (H) 11/05/2020 0747   TRIG 123 11/05/2020 0747   CHOLHDL 5.2 (H) 11/05/2020 0747   CREATININE 1.27 11/05/2020 0747    Wt Readings from Last 3 Encounters:  11/04/20 233 lb (  105.7 kg)  07/29/13 197 lb (89.4 kg)  07/18/13 197 lb (89.4 kg)    GERD, Follow up:  The patient was last seen for GERD 3 months ago. Changes made since that visit include Prilosec 20 mg daily.  He reports excellent compliance with treatment. He is not having side effects. Marland Kitchen  He IS experiencing bilious reflux. He is NOT experiencing difficulty swallowing   Past Medical History:  Diagnosis Date   Allergic rhinitis    GERD (gastroesophageal reflux disease)    Hemorrhoids    Hyperlipidemia 2022   not taking statin therapy currently    Hypertension    does not see a cardiologist    Osteoarthritis     Past Surgical History:  Procedure Laterality Date   APPENDECTOMY     age 48   JOINT REPLACEMENT Right 2015   total knee arthroplasty, Dr Lorin Mercy   KNEE ARTHROPLASTY Right 07/28/2013   Procedure: COMPUTER ASSISTED TOTAL KNEE ARTHROPLASTY;  Surgeon: Marybelle Killings, MD;  Location: [facility];  Service: Orthopedics;  Laterality: Right;  Right Total Knee Arthroplasty   KNEE SURGERY  1976, 1992   right     Family History  Problem Relation Age of Onset   Ovarian cancer Mother    Alzheimer's disease Father     Social History   Socioeconomic History   Marital status: Married    Spouse name: Not on file   Number of children: 3   Years of education: Not on file   Highest education level: Not on file  Occupational History   Not on file  Tobacco Use   Smoking status: Never   Smokeless tobacco: Never  Vaping Use   Vaping Use: Never used  Substance and Sexual Activity   Alcohol use: Yes    Comment: reports beer every now and then   Drug use: No   Sexual activity: Not on file  Other Topics Concern   Not on file  Social History Narrative   Not on file   Social Determinants of Health   Financial Resource Strain: Not on file  Food Insecurity: Not on file  Transportation Needs: Not on file  Physical Activity: Not on file  Stress: Not on file  Social Connections: Not on file  Intimate Partner Violence: Not on file    Outpatient Medications Prior to Visit  Medication Sig Dispense Refill   amLODipine-benazepril (LOTREL) 10-40 MG capsule Take 1 capsule by mouth daily. 90 capsule 0   loratadine (CLARITIN) 10 MG tablet Take 10 mg by mouth daily.     metoprolol succinate (TOPROL-XL) 25 MG 24 hr tablet Take 1 tablet (25 mg total) by mouth daily. 90 tablet 0   omeprazole (PRILOSEC OTC) 20 MG tablet Take 20 mg by mouth daily.     rosuvastatin (CRESTOR) 10 MG tablet Take 1 tablet (10 mg total) by mouth daily. 90 tablet 0   tadalafil (CIALIS) 5 MG tablet Take 1 tablet (5 mg  total) by mouth daily as needed for erectile dysfunction. 30 tablet 2   No facility-administered medications prior to visit.    No Known Allergies  ROS Review of Systems  Constitutional:  Negative for appetite change, fatigue and unexpected weight change.  HENT:  Positive for hearing loss (bilateral ears). Negative for congestion, ear pain, rhinorrhea, sinus pressure, sinus pain and tinnitus.   Eyes:  Positive for visual disturbance (wears eyeglasses). Negative for pain.  Respiratory:  Negative for cough and shortness of breath.   Cardiovascular:  Negative  for chest pain, palpitations and leg swelling.  Gastrointestinal:  Negative for abdominal pain, constipation, diarrhea, nausea and vomiting.  Endocrine: Positive for polydipsia and polyuria (nocturia). Negative for cold intolerance, heat intolerance and polyphagia.  Genitourinary:  Negative for dysuria, frequency and hematuria.  Musculoskeletal:  Positive for arthralgias and myalgias. Negative for back pain and joint swelling.  Skin:  Negative for rash.  Allergic/Immunologic: Negative for environmental allergies.  Neurological:  Negative for dizziness and headaches.  Hematological:  Negative for adenopathy.  Psychiatric/Behavioral:  Negative for decreased concentration and sleep disturbance. The patient is not nervous/anxious.      Objective:    Physical Exam Vitals reviewed.  Constitutional:      Appearance: Normal appearance.  HENT:     Head: Normocephalic.     Right Ear: There is impacted cerumen.     Left Ear: There is impacted cerumen.     Mouth/Throat:     Mouth: Mucous membranes are moist.  Cardiovascular:     Rate and Rhythm: Normal rate and regular rhythm.     Pulses: Normal pulses.     Heart sounds: Normal heart sounds.  Pulmonary:     Effort: Pulmonary effort is normal.     Breath sounds: Normal breath sounds.  Abdominal:     General: Bowel sounds are normal.     Palpations: Abdomen is soft.   Musculoskeletal:        General: Normal range of motion.     Cervical back: Neck supple.  Skin:    General: Skin is warm and dry.     Capillary Refill: Capillary refill takes less than 2 seconds.  Neurological:     General: No focal deficit present.     Mental Status: He is alert and oriented to person, place, and time.  Psychiatric:        Mood and Affect: Mood normal.        Behavior: Behavior normal.   There were no vitals taken for this visit. Wt Readings from Last 3 Encounters:  11/04/20 233 lb (105.7 kg)  07/29/13 197 lb (89.4 kg)  07/18/13 197 lb (89.4 kg)     Health Maintenance Due  Topic Date Due   Pneumococcal Vaccine 63-46 Years old (1 - PCV) Never done   HIV Screening  Never done   Hepatitis C Screening  Never done   Zoster Vaccines- Shingrix (1 of 2) Never done   COVID-19 Vaccine (3 - Booster for Pfizer series) 05/03/2020    Lab Results  Component Value Date   TSH 0.498 11/05/2020   Lab Results  Component Value Date   WBC 9.7 11/05/2020   HGB 15.4 11/05/2020   HCT 43.5 11/05/2020   MCV 90 11/05/2020   PLT 227 11/05/2020   Lab Results  Component Value Date   NA 139 11/05/2020   K 3.7 11/05/2020   CO2 24 11/05/2020   GLUCOSE 138 (H) 11/05/2020   BUN 15 11/05/2020   CREATININE 1.27 11/05/2020   BILITOT 0.4 11/05/2020   ALKPHOS 105 11/05/2020   AST 23 11/05/2020   ALT 45 (H) 11/05/2020   PROT 7.3 11/05/2020   ALBUMIN 4.3 11/05/2020   CALCIUM 9.3 11/05/2020   EGFR 65 11/05/2020   Lab Results  Component Value Date   CHOL 209 (H) 11/05/2020   Lab Results  Component Value Date   HDL 40 11/05/2020   Lab Results  Component Value Date   LDLCALC 147 (H) 11/05/2020   Lab Results  Component Value  Date   TRIG 123 11/05/2020   Lab Results  Component Value Date   CHOLHDL 5.2 (H) 11/05/2020      Assessment & Plan:  1. Essential hypertension-well controlled  - CBC With Diff/Platelet - Comprehensive metabolic panel -Continue Lotrel and  Metoprolol  2. Mixed hyperlipidemia - Lipid panel -heart healthy diet -increase physical activity  3. Gastroesophageal reflux disease without esophagitis-well controlled -Continue Prilosec 20 mg OTC -Avoid food triggers  4. Impaired fasting glucose - Hemoglobin A1c   5. Encounter for screening for malignant neoplasm of prostate - PSA  6. Statin declined  7. Immunization declined  8. Impacted cerumen of both ears  -Pt declined bilateral ear wax removal today in office -Debrox OTC to bilateral ears  Continue medications  Recommend routine eye exam Follow-up fasting 50-month   Follow-up: 61-month fasting  Signed, ShRip HarbourNP

## 2021-02-15 ENCOUNTER — Ambulatory Visit: Payer: BC Managed Care – PPO | Admitting: Nurse Practitioner

## 2021-02-15 ENCOUNTER — Other Ambulatory Visit: Payer: Self-pay

## 2021-02-15 ENCOUNTER — Encounter: Payer: Self-pay | Admitting: Nurse Practitioner

## 2021-02-15 VITALS — BP 110/78 | HR 91 | Temp 97.2°F | Ht 69.0 in | Wt 232.6 lb

## 2021-02-15 DIAGNOSIS — I10 Essential (primary) hypertension: Secondary | ICD-10-CM

## 2021-02-15 DIAGNOSIS — K219 Gastro-esophageal reflux disease without esophagitis: Secondary | ICD-10-CM

## 2021-02-15 DIAGNOSIS — H6123 Impacted cerumen, bilateral: Secondary | ICD-10-CM

## 2021-02-15 DIAGNOSIS — R7301 Impaired fasting glucose: Secondary | ICD-10-CM

## 2021-02-15 DIAGNOSIS — Z5320 Procedure and treatment not carried out because of patient's decision for unspecified reasons: Secondary | ICD-10-CM

## 2021-02-15 DIAGNOSIS — Z2821 Immunization not carried out because of patient refusal: Secondary | ICD-10-CM

## 2021-02-15 DIAGNOSIS — E782 Mixed hyperlipidemia: Secondary | ICD-10-CM

## 2021-02-15 DIAGNOSIS — Z125 Encounter for screening for malignant neoplasm of prostate: Secondary | ICD-10-CM

## 2021-02-15 NOTE — Patient Instructions (Addendum)
Continue medications  Recommend routine eye exam Follow-up fasting 49-months   Preventive Care 56-61 Years Old, Male Preventive care refers to lifestyle choices and visits with your health care provider that can promote health and wellness. This includes: A yearly physical exam. This is also called an annual wellness visit. Regular dental and eye exams. Immunizations. Screening for certain conditions. Healthy lifestyle choices, such as: Eating a healthy diet. Getting regular exercise. Not using drugs or products that contain nicotine and tobacco. Limiting alcohol use. What can I expect for my preventive care visit? Physical exam Your health care provider will check your: Height and weight. These may be used to calculate your BMI (body mass index). BMI is a measurement that tells if you are at a healthy weight. Heart rate and blood pressure. Body temperature. Skin for abnormal spots. Counseling Your health care provider may ask you questions about your: Past medical problems. Family's medical history. Alcohol, tobacco, and drug use. Emotional well-being. Home life and relationship well-being. Sexual activity. Diet, exercise, and sleep habits. Work and work Astronomer. Access to firearms. What immunizations do I need?  Vaccines are usually given at various ages, according to a schedule. Your health care provider will recommend vaccines for you based on your age, medicalhistory, and lifestyle or other factors, such as travel or where you work. What tests do I need? Blood tests Lipid and cholesterol levels. These may be checked every 5 years, or more often if you are over 61 years old. Hepatitis C test. Hepatitis B test. Screening Lung cancer screening. You may have this screening every year starting at age 61 if you have a 30-pack-year history of smoking and currently smoke or have quit within the past 15 years. Prostate cancer screening. Recommendations will vary depending  on your family history and other risks. Genital exam to check for testicular cancer or hernias. Colorectal cancer screening. All adults should have this screening starting at age 61 and continuing until age 57. Your health care provider may recommend screening at age 61 if you are at increased risk. You will have tests every 1-10 years, depending on your results and the type of screening test. Diabetes screening. This is done by checking your blood sugar (glucose) after you have not eaten for a while (fasting). You may have this done every 1-3 years. STD (sexually transmitted disease) testing, if you are at risk. Follow these instructions at home: Eating and drinking  Eat a diet that includes fresh fruits and vegetables, whole grains, lean protein, and low-fat dairy products. Take vitamin and mineral supplements as recommended by your health care provider. Do not drink alcohol if your health care provider tells you not to drink. If you drink alcohol: Limit how much you have to 0-2 drinks a day. Be aware of how much alcohol is in your drink. In the [place] one drink equals one 12 oz bottle of beer (355 mL), one 5 oz glass of wine (148 mL), or one 1 oz glass of hard liquor (44 mL).  Lifestyle Take daily care of your teeth and gums. Brush your teeth every morning and night with fluoride toothpaste. Floss one time each day. Stay active. Exercise for at least 30 minutes 5 or more days each week. Do not use any products that contain nicotine or tobacco, such as cigarettes, e-cigarettes, and chewing tobacco. If you need help quitting, ask your health care provider. Do not use drugs. If you are sexually active, practice safe sex. Use a condom or other  form of protection to prevent STIs (sexually transmitted infections). If told by your health care provider, take low-dose aspirin daily starting at age 61. Find healthy ways to cope with stress, such as: Meditation, yoga, or listening to  music. Journaling. Talking to a trusted person. Spending time with friends and family. Safety Always wear your seat belt while driving or riding in a vehicle. Do not drive: If you have been drinking alcohol. Do not ride with someone who has been drinking. When you are tired or distracted. While texting. Wear a helmet and other protective equipment during sports activities. If you have firearms in your house, make sure you follow all gun safety procedures. What's next? Go to your health care provider once a year for an annual wellness visit. Ask your health care provider how often you should have your eyes and teeth checked. Stay up to date on all vaccines. This information is not intended to replace advice given to you by your health care provider. Make sure you discuss any questions you have with your healthcare provider. Document Revised: 05/20/2019 Document Reviewed: 08/15/2018 Elsevier Patient Education  2022 [employer].

## 2021-02-16 LAB — COMPREHENSIVE METABOLIC PANEL
ALT: 36 IU/L (ref 0–44)
AST: 17 IU/L (ref 0–40)
Albumin/Globulin Ratio: 1.5 (ref 1.2–2.2)
Albumin: 4.3 g/dL (ref 3.8–4.8)
Alkaline Phosphatase: 101 IU/L (ref 44–121)
BUN/Creatinine Ratio: 14 (ref 10–24)
BUN: 16 mg/dL (ref 8–27)
Bilirubin Total: 0.4 mg/dL (ref 0.0–1.2)
CO2: 25 mmol/L (ref 20–29)
Calcium: 9.3 mg/dL (ref 8.6–10.2)
Chloride: 100 mmol/L (ref 96–106)
Creatinine, Ser: 1.15 mg/dL (ref 0.76–1.27)
Globulin, Total: 2.9 g/dL (ref 1.5–4.5)
Glucose: 181 mg/dL — ABNORMAL HIGH (ref 65–99)
Potassium: 3.5 mmol/L (ref 3.5–5.2)
Sodium: 141 mmol/L (ref 134–144)
Total Protein: 7.2 g/dL (ref 6.0–8.5)
eGFR: 72 mL/min/{1.73_m2} (ref >59)

## 2021-02-16 LAB — CBC WITH DIFF/PLATELET
Basophils Absolute: 0.1 10*3/uL (ref 0.0–0.2)
Basos: 1 %
EOS (ABSOLUTE): 0.3 10*3/uL (ref 0.0–0.4)
Eos: 5 %
Hematocrit: 44.5 % (ref 37.5–51.0)
Hemoglobin: 15.5 g/dL (ref 13.0–17.7)
Immature Grans (Abs): 0.1 10*3/uL (ref 0.0–0.1)
Immature Granulocytes: 1 %
Lymphocytes Absolute: 2.5 10*3/uL (ref 0.7–3.1)
Lymphs: 35 %
MCH: 31.4 pg (ref 26.6–33.0)
MCHC: 34.8 g/dL (ref 31.5–35.7)
MCV: 90 fL (ref 79–97)
Monocytes Absolute: 0.7 10*3/uL (ref 0.1–0.9)
Monocytes: 10 %
Neutrophils Absolute: 3.5 10*3/uL (ref 1.4–7.0)
Neutrophils: 48 %
Platelets: 223 10*3/uL (ref 150–450)
RBC: 4.94 x10E6/uL (ref 4.14–5.80)
RDW: 12.6 % (ref 11.6–15.4)
WBC: 7.1 10*3/uL (ref 3.4–10.8)

## 2021-02-16 LAB — LIPID PANEL
Chol/HDL Ratio: 5.1 ratio — ABNORMAL HIGH (ref 0.0–5.0)
Cholesterol, Total: 205 mg/dL — ABNORMAL HIGH (ref 100–199)
HDL: 40 mg/dL (ref >39)
LDL Chol Calc (NIH): 137 mg/dL — ABNORMAL HIGH (ref 0–99)
Triglycerides: 157 mg/dL — ABNORMAL HIGH (ref 0–149)
VLDL Cholesterol Cal: 28 mg/dL (ref 5–40)

## 2021-02-16 LAB — HEMOGLOBIN A1C
Est. average glucose Bld gHb Est-mCnc: 203 mg/dL
Hgb A1c MFr Bld: 8.7 % — ABNORMAL HIGH (ref 4.8–5.6)

## 2021-02-16 LAB — PSA: Prostate Specific Ag, Serum: 1.5 ng/mL (ref 0.0–4.0)

## 2021-02-16 LAB — CARDIOVASCULAR RISK ASSESSMENT

## 2021-02-21 ENCOUNTER — Other Ambulatory Visit: Payer: Self-pay | Admitting: Nurse Practitioner

## 2021-02-21 DIAGNOSIS — E1165 Type 2 diabetes mellitus with hyperglycemia: Secondary | ICD-10-CM

## 2021-02-21 DIAGNOSIS — Z5320 Procedure and treatment not carried out because of patient's decision for unspecified reasons: Secondary | ICD-10-CM

## 2021-02-21 MED ORDER — METFORMIN HCL 500 MG PO TABS: 500.0000 mg | ORAL_TABLET | Freq: Two times a day (BID) | ORAL | 3 refills | Status: DC

## 2021-02-25 ENCOUNTER — Encounter: Payer: BC Managed Care – PPO | Admitting: Gastroenterology

## 2021-04-01 ENCOUNTER — Other Ambulatory Visit: Payer: Self-pay

## 2021-04-01 ENCOUNTER — Telehealth: Payer: Self-pay | Admitting: *Deleted

## 2021-04-01 NOTE — Telephone Encounter (Signed)
Unable to reach patient for pre visit. Message left asking for return call to avoid cancellation of up-coming colonoscopy.

## 2021-04-04 ENCOUNTER — Telehealth: Payer: Self-pay | Admitting: *Deleted

## 2021-04-04 NOTE — Telephone Encounter (Signed)
No return call from patient.  Colonoscopy cancelled and missed appointment letter mailed. 

## 2021-04-18 ENCOUNTER — Other Ambulatory Visit: Payer: Self-pay

## 2021-04-18 DIAGNOSIS — N401 Enlarged prostate with lower urinary tract symptoms: Secondary | ICD-10-CM

## 2021-04-18 DIAGNOSIS — R351 Nocturia: Secondary | ICD-10-CM

## 2021-04-18 DIAGNOSIS — I10 Essential (primary) hypertension: Secondary | ICD-10-CM

## 2021-04-18 MED ORDER — METOPROLOL SUCCINATE ER 25 MG PO TB24: 25.0000 mg | ORAL_TABLET | Freq: Every day | ORAL | 0 refills | Status: DC

## 2021-04-18 MED ORDER — AMLODIPINE BESY-BENAZEPRIL HCL 10-40 MG PO CAPS: 1.0000 | ORAL_CAPSULE | Freq: Every day | ORAL | 0 refills | Status: DC

## 2021-04-18 MED ORDER — TADALAFIL 5 MG PO TABS: 5.0000 mg | ORAL_TABLET | Freq: Every day | ORAL | 2 refills | Status: DC | PRN

## 2021-04-22 ENCOUNTER — Encounter: Payer: BC Managed Care – PPO | Admitting: Gastroenterology

## 2021-05-07 ENCOUNTER — Telehealth (INDEPENDENT_AMBULATORY_CARE_PROVIDER_SITE_OTHER): Payer: BC Managed Care – PPO | Admitting: Family Medicine

## 2021-05-07 ENCOUNTER — Encounter: Payer: Self-pay | Admitting: Family Medicine

## 2021-05-07 ENCOUNTER — Other Ambulatory Visit: Payer: Self-pay | Admitting: Family Medicine

## 2021-05-07 ENCOUNTER — Other Ambulatory Visit: Payer: Self-pay

## 2021-05-07 DIAGNOSIS — U071 COVID-19: Secondary | ICD-10-CM

## 2021-05-07 DIAGNOSIS — J069 Acute upper respiratory infection, unspecified: Secondary | ICD-10-CM

## 2021-05-07 MED ORDER — NIRMATRELVIR/RITONAVIR (PAXLOVID)TABLET: 3.0000 | ORAL_TABLET | Freq: Two times a day (BID) | ORAL | 0 refills | Status: AC

## 2021-05-07 MED ORDER — NIRMATRELVIR/RITONAVIR (PAXLOVID)TABLET: 3.0000 | ORAL_TABLET | Freq: Two times a day (BID) | ORAL | 0 refills | Status: DC

## 2021-05-07 NOTE — Progress Notes (Signed)
Virtual Visit via Video Note   This visit type was conducted due to national recommendations for restrictions regarding the COVID-19 Pandemic (e.g. social distancing) in an effort to limit this patient's exposure and mitigate transmission in our community.  Due to his co-morbid illnesses, this patient is at least at moderate risk for complications without adequate follow up.  This format is felt to be most appropriate for this patient at this time.  All issues noted in this document were discussed and addressed.  A limited physical exam was performed with this format.  A verbal consent was obtained for the virtual visit.   Date:  05/07/2021   ID:  Gregory Marks, DOB Apr 30, 1961, MRN [id number]  Patient Location: Home Provider Location: Home Office  PCP:  Janie Morning, NP   Evaluation Performed:  acute  Chief Complaint:  covid 19  History of Present Illness:    Gregory Marks is a 60 y.o. male with cough, congestion, aching, and chills since yesterday. Pt tested positive for covid 19. He is staying at a hotel so as not to expose his wife and family. Denies SOB or chest pain. Appetite is good. Pt is hydrating.  The patient does have symptoms concerning for COVID-19 infection.   Past Medical History:  Diagnosis Date   Allergic rhinitis    GERD (gastroesophageal reflux disease)    Hemorrhoids    Hyperlipidemia 2022   not taking statin therapy currently    Hypertension    does not see a cardiologist   Osteoarthritis     Past Surgical History:  Procedure Laterality Date   APPENDECTOMY     age 17   JOINT REPLACEMENT Right 2015   total knee arthroplasty, Dr Ophelia Charter   KNEE ARTHROPLASTY Right 07/28/2013   Procedure: COMPUTER ASSISTED TOTAL KNEE ARTHROPLASTY;  Surgeon: Eldred Manges, MD;  Location: [facility] OR;  Service: Orthopedics;  Laterality: Right;  Right Total Knee Arthroplasty   KNEE SURGERY  1976, 1992   right     Family History  Problem Relation Age of Onset   Ovarian cancer  Mother    Alzheimer's disease Father     Social History   Socioeconomic History   Marital status: Married    Spouse name: Not on file   Number of children: 3   Years of education: Not on file   Highest education level: Not on file  Occupational History   Not on file  Tobacco Use   Smoking status: Never   Smokeless tobacco: Never  Vaping Use   Vaping Use: Never used  Substance and Sexual Activity   Alcohol use: Yes    Comment: reports beer every now and then   Drug use: No   Sexual activity: Not on file  Other Topics Concern   Not on file  Social History Narrative   Not on file   Social Determinants of Health   Financial Resource Strain: Not on file  Food Insecurity: Not on file  Transportation Needs: Not on file  Physical Activity: Not on file  Stress: Not on file  Social Connections: Not on file  Intimate Partner Violence: Not on file    Outpatient Medications Prior to Visit  Medication Sig Dispense Refill   amLODipine-benazepril (LOTREL) 10-40 MG capsule Take 1 capsule by mouth daily. 90 capsule 0   loratadine (CLARITIN) 10 MG tablet Take 10 mg by mouth daily.     metFORMIN (GLUCOPHAGE) 500 MG tablet Take 1 tablet (500 mg total) by mouth 2 (  two) times daily with a meal. 180 tablet 3   metoprolol succinate (TOPROL-XL) 25 MG 24 hr tablet Take 1 tablet (25 mg total) by mouth daily. 90 tablet 0   nirmatrelvir/ritonavir EUA (PAXLOVID) 20 x 150 MG & 10 x 100MG  TABS Take 3 tablets by mouth 2 (two) times daily for 5 days. (Take nirmatrelvir 150 mg two tablets twice daily for 5 days and ritonavir 100 mg one tablet twice daily for 5 days) Patient GFR is 65. 30 tablet 0   omeprazole (PRILOSEC OTC) 20 MG tablet Take 20 mg by mouth daily.     rosuvastatin (CRESTOR) 10 MG tablet Take 1 tablet (10 mg total) by mouth daily. 90 tablet 0   tadalafil (CIALIS) 5 MG tablet Take 1 tablet (5 mg total) by mouth daily as needed for erectile dysfunction. 30 tablet 2   No  facility-administered medications prior to visit.    Allergies:   Patient has no known allergies.   Social History   Tobacco Use   Smoking status: Never   Smokeless tobacco: Never  Vaping Use   Vaping Use: Never used  Substance Use Topics   Alcohol use: Yes    Comment: reports beer every now and then   Drug use: No     Review of Systems  Constitutional:  Positive for chills, fever and malaise/fatigue.  HENT:  Negative for ear pain, sinus pain and sore throat.   Respiratory:  Positive for cough. Negative for shortness of breath.   Cardiovascular:  Negative for chest pain.  Musculoskeletal:  Positive for myalgias.  Neurological:  Negative for headaches.    Labs/Other Tests and Data Reviewed:    Recent Labs: 11/05/2020: TSH 0.498 02/15/2021: ALT 36; BUN 16; Creatinine, Ser 1.15; Hemoglobin 15.5; Platelets 223; Potassium 3.5; Sodium 141   Recent Lipid Panel Lab Results  Component Value Date/Time   CHOL 205 (H) 02/15/2021 08:04 AM   TRIG 157 (H) 02/15/2021 08:04 AM   HDL 40 02/15/2021 08:04 AM   CHOLHDL 5.1 (H) 02/15/2021 08:04 AM   LDLCALC 137 (H) 02/15/2021 08:04 AM    Wt Readings from Last 3 Encounters:  02/15/21 232 lb 9.6 oz (105.5 kg)  11/04/20 233 lb (105.7 kg)  07/29/13 197 lb (89.4 kg)     Objective:    Vital Signs:  There were no vitals taken for this visit.   Physical Exam Constitutional:      Appearance: Normal appearance.  Neurological:     Mental Status: He is alert.     ASSESSMENT & PLAN:   1. Upper respiratory tract infection due to COVID-19 virus  Paxlovid sent. Hold rosuvastatin. Recommend tylenol and otc cough medicine.   COVID-19 Education: Isolate x 5 days, then wear mask x 5 more days.    I spent 10 minutes dedicated to the care of this patient on the date of this encounter to include face-to-face time with the patient, as well as: chart review.  Follow Up:  In Person prn  Signed, 07/31/13, MD  05/07/2021 7:16 PM    [facility] [place]

## 2021-05-07 NOTE — Progress Notes (Signed)
Virtual Visit via Video Note   This visit type was conducted due to national recommendations for restrictions regarding the COVID-19 Pandemic (e.g. social distancing) in an effort to limit this patient's exposure and mitigate transmission in our community.  Due to his co-morbid illnesses, this patient is at least at moderate risk for complications without adequate follow up.  This format is felt to be most appropriate for this patient at this time.  All issues noted in this document were discussed and addressed.  A limited physical exam was performed with this format.  A verbal consent was obtained for the virtual visit.   Date:  07/04/2021   ID:  Gregory Marks, DOB 1960-11-03, MRN [id number]  Patient location: home Provider Location: Home Office  PCP:  Janie Morning, NP   Evaluation Performed: acute visit  Chief Complaint:  Covid positive.   History of Present Illness:    Gregory Marks is a 60 y.o. male with congestion, coughing, chills.  no sob, or chest pain. Positive covid 19 positive.. ON mucinex and delsym.  Drinking a lot.   The patient does have symptoms concerning for COVID-19 infection (fever, chills, cough, or new shortness of breath).    Past Medical History:  Diagnosis Date   Allergic rhinitis    GERD (gastroesophageal reflux disease)    Hemorrhoids    Hyperlipidemia 2022   not taking statin therapy currently    Hypertension    does not see a cardiologist   Osteoarthritis     Past Surgical History:  Procedure Laterality Date   APPENDECTOMY     age 39   JOINT REPLACEMENT Right 2015   total knee arthroplasty, Dr Ophelia Charter   KNEE ARTHROPLASTY Right 07/28/2013   Procedure: COMPUTER ASSISTED TOTAL KNEE ARTHROPLASTY;  Surgeon: Eldred Manges, MD;  Location: [facility] OR;  Service: Orthopedics;  Laterality: Right;  Right Total Knee Arthroplasty   KNEE SURGERY  1976, 1992   right     Family History  Problem Relation Age of Onset   Ovarian cancer Mother    Alzheimer's  disease Father     Social History   Socioeconomic History   Marital status: Married    Spouse name: Not on file   Number of children: 3   Years of education: Not on file   Highest education level: Not on file  Occupational History   Not on file  Tobacco Use   Smoking status: Never   Smokeless tobacco: Never  Vaping Use   Vaping Use: Never used  Substance and Sexual Activity   Alcohol use: Yes    Comment: reports beer every now and then   Drug use: No   Sexual activity: Not on file  Other Topics Concern   Not on file  Social History Narrative   Not on file   Social Determinants of Health   Financial Resource Strain: Not on file  Food Insecurity: Not on file  Transportation Needs: Not on file  Physical Activity: Not on file  Stress: Not on file  Social Connections: Not on file  Intimate Partner Violence: Not on file    Outpatient Medications Prior to Visit  Medication Sig Dispense Refill   amLODipine-benazepril (LOTREL) 10-40 MG capsule Take 1 capsule by mouth daily. 90 capsule 0   loratadine (CLARITIN) 10 MG tablet Take 10 mg by mouth daily.     metFORMIN (GLUCOPHAGE) 500 MG tablet Take 1 tablet (500 mg total) by mouth 2 (two) times daily with a meal. 180  tablet 3   metoprolol succinate (TOPROL-XL) 25 MG 24 hr tablet Take 1 tablet (25 mg total) by mouth daily. 90 tablet 0   omeprazole (PRILOSEC OTC) 20 MG tablet Take 20 mg by mouth daily.     rosuvastatin (CRESTOR) 10 MG tablet Take 1 tablet (10 mg total) by mouth daily. 90 tablet 0   tadalafil (CIALIS) 5 MG tablet Take 1 tablet (5 mg total) by mouth daily as needed for erectile dysfunction. 30 tablet 2   No facility-administered medications prior to visit.    Allergies:   Patient has no known allergies.   Social History   Tobacco Use   Smoking status: Never   Smokeless tobacco: Never  Vaping Use   Vaping Use: Never used  Substance Use Topics   Alcohol use: Yes    Comment: reports beer every now and then    Drug use: No     Review of Systems  Constitutional:  Positive for chills. Negative for fever.  HENT:  Positive for congestion. Negative for ear pain, sinus pain and sore throat.   Respiratory:  Positive for cough. Negative for shortness of breath.   Cardiovascular:  Negative for chest pain.    Labs/Other Tests and Data Reviewed:    Recent Labs: 11/05/2020: TSH 0.498 02/15/2021: ALT 36; BUN 16; Creatinine, Ser 1.15; Hemoglobin 15.5; Platelets 223; Potassium 3.5; Sodium 141   Recent Lipid Panel Lab Results  Component Value Date/Time   CHOL 205 (H) 02/15/2021 08:04 AM   TRIG 157 (H) 02/15/2021 08:04 AM   HDL 40 02/15/2021 08:04 AM   CHOLHDL 5.1 (H) 02/15/2021 08:04 AM   LDLCALC 137 (H) 02/15/2021 08:04 AM    Wt Readings from Last 3 Encounters:  02/15/21 232 lb 9.6 oz (105.5 kg)  11/04/20 233 lb (105.7 kg)  07/29/13 197 lb (89.4 kg)     Objective:    Vital Signs:  There were no vitals taken for this visit.   Physical Exam Constitutional:      Appearance: Normal appearance.  Neurological:     Mental Status: He is alert.     ASSESSMENT & PLAN:   1. Upper respiratory tract infection due to COVID-19 virus  Paxlovid sent.  Discussed isolation with patient.  Discussed symptom management.   Meds ordered this encounter  Medications   DISCONTD: nirmatrelvir/ritonavir EUA (PAXLOVID) 20 x 150 MG & 10 x 100MG  TABS    Sig: Take 3 tablets by mouth 2 (two) times daily for 5 days. (Take nirmatrelvir 150 mg two tablets twice daily for 5 days and ritonavir 100 mg one tablet twice daily for 5 days) Patient GFR is 65.    Dispense:  30 tablet    Refill:  0    Pt to hold rosuvastatin while on paxlovid.   nirmatrelvir/ritonavir EUA (PAXLOVID) 20 x 150 MG & 10 x 100MG  TABS    Sig: Take 3 tablets by mouth 2 (two) times daily for 5 days. (Take nirmatrelvir 150 mg two tablets twice daily for 5 days and ritonavir 100 mg one tablet twice daily for 5 days) Patient GFR is 65.    Dispense:  30  tablet    Refill:  0    Pt to hold rosuvastatin while on paxlovid.    COVID-19 Education: The signs and symptoms of COVID-19 were discussed with the patient and how to seek care for testing (follow up with PCP or arrange E-visit). The importance of social distancing was discussed today.   I spent 8  minutes dedicated to the care of this patient on the date of this encounter to include face-to-face time with the patient.  Follow Up:  Virtual Visit  prn  Signed, Blane Ohara, MD  07/04/2021 5:58 AM    [facility] [place]

## 2021-06-30 ENCOUNTER — Other Ambulatory Visit: Payer: Self-pay | Admitting: Nurse Practitioner

## 2021-06-30 DIAGNOSIS — N401 Enlarged prostate with lower urinary tract symptoms: Secondary | ICD-10-CM

## 2021-07-04 ENCOUNTER — Encounter: Payer: Self-pay | Admitting: Family Medicine

## 2021-07-04 DIAGNOSIS — U071 COVID-19: Secondary | ICD-10-CM | POA: Insufficient documentation

## 2021-07-04 DIAGNOSIS — J069 Acute upper respiratory infection, unspecified: Secondary | ICD-10-CM | POA: Insufficient documentation

## 2021-07-26 ENCOUNTER — Other Ambulatory Visit: Payer: Self-pay | Admitting: Nurse Practitioner

## 2021-07-26 DIAGNOSIS — I10 Essential (primary) hypertension: Secondary | ICD-10-CM

## 2021-08-17 ENCOUNTER — Ambulatory Visit: Payer: BC Managed Care – PPO | Admitting: Nurse Practitioner

## 2021-08-25 ENCOUNTER — Other Ambulatory Visit: Payer: Self-pay | Admitting: Nurse Practitioner

## 2021-08-25 DIAGNOSIS — I10 Essential (primary) hypertension: Secondary | ICD-10-CM

## 2021-09-13 ENCOUNTER — Ambulatory Visit: Payer: BC Managed Care – PPO | Admitting: Nurse Practitioner

## 2021-09-19 ENCOUNTER — Encounter: Payer: Self-pay | Admitting: Nurse Practitioner

## 2021-09-19 ENCOUNTER — Other Ambulatory Visit: Payer: Self-pay

## 2021-09-19 ENCOUNTER — Ambulatory Visit: Payer: BC Managed Care – PPO | Admitting: Nurse Practitioner

## 2021-09-19 VITALS — BP 130/84 | HR 72 | Temp 97.7°F | Resp 16 | Ht 70.0 in | Wt 220.0 lb

## 2021-09-19 DIAGNOSIS — E782 Mixed hyperlipidemia: Secondary | ICD-10-CM

## 2021-09-19 DIAGNOSIS — E1165 Type 2 diabetes mellitus with hyperglycemia: Secondary | ICD-10-CM | POA: Diagnosis not present

## 2021-09-19 DIAGNOSIS — K219 Gastro-esophageal reflux disease without esophagitis: Secondary | ICD-10-CM | POA: Diagnosis not present

## 2021-09-19 DIAGNOSIS — I10 Essential (primary) hypertension: Secondary | ICD-10-CM

## 2021-09-19 DIAGNOSIS — N529 Male erectile dysfunction, unspecified: Secondary | ICD-10-CM

## 2021-09-19 MED ORDER — TADALAFIL 10 MG PO TABS: 10.0000 mg | ORAL_TABLET | Freq: Every day | ORAL | 2 refills | Status: DC | PRN

## 2021-09-19 NOTE — Patient Instructions (Addendum)
Continue medications We will call you with lab results tomorrow Recommend screening eye exam Follow-up in 47-months, fasting    Diabetes Mellitus and [place] care is an important part of your health, especially when you have diabetes. Diabetes may cause you to have problems because of poor blood flow (circulation) to your feet and legs, which can cause your skin to: Become thinner and drier. Break more easily. Heal more slowly. Peel and crack. You may also have nerve damage (neuropathy) in your legs and feet, causing decreased feeling in them. This means that you may not notice minor injuries to your feet that could lead to more serious problems. Noticing and addressing any potential problems early is the best way to prevent future foot problems. How to care for your feet Foot hygiene  Wash your feet daily with warm water and mild soap. Do not use hot water. Then, pat your feet and the areas between your toes until they are completely dry. Do not soak your feet as this can dry your skin. Trim your toenails straight across. Do not dig under them or around the cuticle. File the edges of your nails with an emery board or nail file. Apply a moisturizing lotion or petroleum jelly to the skin on your feet and to dry, brittle toenails. Use lotion that does not contain alcohol and is unscented. Do not apply lotion between your toes. Shoes and socks Wear clean socks or stockings every day. Make sure they are not too tight. Do not wear knee-high stockings since they may decrease blood flow to your legs. Wear shoes that fit properly and have enough cushioning. Always look in your shoes before you put them on to be sure there are no objects inside. To break in new shoes, wear them for just a few hours a day. This prevents injuries on your feet. Wounds, scrapes, corns, and calluses  Check your feet daily for blisters, cuts, bruises, sores, and redness. If you cannot see the bottom of your feet,  use a mirror or ask someone for help. Do not cut corns or calluses or try to remove them with medicine. If you find a minor scrape, cut, or break in the skin on your feet, keep it and the skin around it clean and dry. You may clean these areas with mild soap and water. Do not clean the area with peroxide, alcohol, or iodine. If you have a wound, scrape, corn, or callus on your foot, look at it several times a day to make sure it is healing and not infected. Check for: Redness, swelling, or pain. Fluid or blood. Warmth. Pus or a bad smell. General tips Do not cross your legs. This may decrease blood flow to your feet. Do not use heating pads or hot water bottles on your feet. They may burn your skin. If you have lost feeling in your feet or legs, you may not know this is happening until it is too late. Protect your feet from hot and cold by wearing shoes, such as at the beach or on hot pavement. Schedule a complete foot exam at least once a year (annually) or more often if you have foot problems. Report any cuts, sores, or bruises to your health care provider immediately. Where to find more information American Diabetes Association: www.diabetes.org Association of Diabetes Care & Education Specialists: www.diabeteseducator.org Contact a health care provider if: You have a medical condition that increases your risk of infection and you have any cuts, sores, or  bruises on your feet. You have an injury that is not healing. You have redness on your legs or feet. You feel burning or tingling in your legs or feet. You have pain or cramps in your legs and feet. Your legs or feet are numb. Your feet always feel cold. You have pain around any toenails. Get help right away if: You have a wound, scrape, corn, or callus on your foot and: You have pain, swelling, or redness that gets worse. You have fluid or blood coming from the wound, scrape, corn, or callus. Your wound, scrape, corn, or callus  feels warm to the touch. You have pus or a bad smell coming from the wound, scrape, corn, or callus. You have a fever. You have a red line going up your leg. Summary Check your feet every day for blisters, cuts, bruises, sores, and redness. Apply a moisturizing lotion or petroleum jelly to the skin on your feet and to dry, brittle toenails. Wear shoes that fit properly and have enough cushioning. If you have foot problems, report any cuts, sores, or bruises to your health care provider immediately. Schedule a complete foot exam at least once a year (annually) or more often if you have foot problems. This information is not intended to replace advice given to you by your health care provider. Make sure you discuss any questions you have with your health care provider. Document Revised: 03/11/2020 Document Reviewed: 03/11/2020 Elsevier Patient Education  [employer] [place]. Diabetes Mellitus and Exercise Exercising regularly is important for overall health, especially for people who have diabetes mellitus. Exercising is not only about losing weight. It has many other health benefits, such as increasing muscle strength and bone density and reducing body fat and stress. This leads to improved fitness, flexibility, and endurance, all of which result in better overall health. What are the benefits of exercise if I have diabetes? Exercise has many benefits for people with diabetes. They include: Helping to lower and control blood sugar (glucose). Helping the body to respond better to the hormone insulin by improving insulin sensitivity. Reducing how much insulin the body needs. Lowering the risk for heart disease by: Lowering "bad" cholesterol and triglyceride levels. Increasing "good" cholesterol levels. Lowering blood pressure. Lowering blood glucose levels. What is my activity plan? Your health care provider or certified diabetes educator can help you make a plan for the type and frequency of  exercise that works for you. This is called your activity plan. Be sure to: Get at least 150 minutes of medium-intensity or high-intensity exercise each week. Exercises may include brisk walking, biking, or water aerobics. Do stretching and strengthening exercises, such as yoga or weight lifting, at least 2 times a week. Spread out your activity over at least 3 days of the week. Get some form of physical activity each day. Do not go more than 2 days in a row without some kind of physical activity. Avoid being inactive for more than 90 minutes at a time. Take frequent breaks to walk or stretch. Choose exercises or activities that you enjoy. Set realistic goals. Start slowly and gradually increase your exercise intensity over time. How do I manage my diabetes during exercise? Monitor your blood glucose Check your blood glucose before and after exercising. If your blood glucose is: 240 mg/dL (13.3 mmol/L) or higher before you exercise, check your urine for ketones. These are chemicals created by the liver. If you have ketones in your urine, do not exercise until your blood glucose  returns to normal. 100 mg/dL (5.6 mmol/L) or lower, eat a snack containing 15-20 grams of carbohydrate. Check your blood glucose 15 minutes after the snack to make sure that your glucose level is above 100 mg/dL (5.6 mmol/L) before you start your exercise. Know the symptoms of low blood glucose (hypoglycemia) and how to treat it. Your risk for hypoglycemia increases during and after exercise. Follow these tips and your health care provider's instructions Keep a carbohydrate snack that is fast-acting for use before, during, and after exercise to help prevent or treat hypoglycemia. Avoid injecting insulin into areas of the body that are going to be exercised. For example, avoid injecting insulin into: Your arms, when you are about to play tennis. Your legs, when you are about to go jogging. Keep records of your exercise  habits. Doing this can help you and your health care provider adjust your diabetes management plan as needed. Write down: Food that you eat before and after you exercise. Blood glucose levels before and after you exercise. The type and amount of exercise you have done. Work with your health care provider when you start a new exercise or activity. He or she may need to: Make sure that the activity is safe for you. Adjust your insulin, other medicines, and food that you eat. Drink plenty of water while you exercise. This prevents loss of water (dehydration) and problems caused by a lot of heat in the body (heat stroke). Where to find more information American Diabetes Association: www.diabetes.org Summary Exercising regularly is important for overall health, especially for people who have diabetes mellitus. Exercising has many health benefits. It increases muscle strength and bone density and reduces body fat and stress. It also lowers and controls blood glucose. Your health care provider or certified diabetes educator can help you make an activity plan for the type and frequency of exercise that works for you. Work with your health care provider to make sure any new activity is safe for you. Also work with your health care provider to adjust your insulin, other medicines, and the food you eat. This information is not intended to replace advice given to you by your health care provider. Make sure you discuss any questions you have with your health care provider. Document Revised: 05/19/2019 Document Reviewed: 05/19/2019 Elsevier Patient Education  [place].

## 2021-09-19 NOTE — Progress Notes (Signed)
Subjective:  Patient ID: Gregory Marks, male    DOB: 1961/01/31  Age: 61 y.o. MRN: [id number]  Chief Complaint  Patient presents with   Diabetes   Hyperlipidemia   Hypertension    HPI     Hypertension, follow-up: He was last seen for hypertension 6 months ago.  BP at that visit was 110/78. Management since that visit includes Metoprolol and .  He reports good compliance with treatment. He is not having side effects.  He is following a Diabetic diet. He is exercising. He does not smoke.  Use of agents associated with hypertension: NSAIDS.   Outside blood pressures are not being checked. Symptoms: No chest pain No chest pressure  No palpitations No syncope  No dyspnea No orthopnea  No paroxysmal nocturnal dyspnea No lower extremity edema   Pertinent labs: Lab Results  Component Value Date   CHOL 205 (H) 02/15/2021   HDL 40 02/15/2021   LDLCALC 137 (H) 02/15/2021   TRIG 157 (H) 02/15/2021   CHOLHDL 5.1 (H) 02/15/2021   Lab Results  Component Value Date   NA 141 02/15/2021   K 3.5 02/15/2021   CREATININE 1.15 02/15/2021   EGFR 72 02/15/2021   GFRNONAA >90 07/29/2013   GLUCOSE 181 (H) 02/15/2021     The 10-year ASCVD risk score (Arnett DK, et al., 2019) is: 27.5%    Lipid/Cholesterol, Follow-up  Last lipid panel Other pertinent labs  Lab Results  Component Value Date   CHOL 205 (H) 02/15/2021   HDL 40 02/15/2021   LDLCALC 137 (H) 02/15/2021   TRIG 157 (H) 02/15/2021   CHOLHDL 5.1 (H) 02/15/2021   Lab Results  Component Value Date   ALT 36 02/15/2021   AST 17 02/15/2021   PLT 223 02/15/2021   TSH 0.498 11/05/2020     He was last seen for this 6 months ago.  Management since that visit includes diet, pt refuses statin therapy.  He reports good compliance with treatment. He is not having side effects.    Diabetes Mellitus Type II, Follow-up  Lab Results  Component Value Date   HGBA1C 8.7 (H) 02/15/2021   Wt Readings from Last 3 Encounters:   02/15/21 232 lb 9.6 oz (105.5 kg)  11/04/20 233 lb (105.7 kg)  07/29/13 197 lb (89.4 kg)   Last seen for diabetes 6 months ago.  Management since then includes Metformin 500 mg BID. He reports excellent compliance with treatment. He is not having side effects.    Home blood sugar records:  not checking blood glucose  Episodes of hypoglycemia? No    Current insulin regiment: not taking Current exercise: walking Current diet habits: in general, a "healthy" diet    Pertinent Labs: Lab Results  Component Value Date   CHOL 205 (H) 02/15/2021   HDL 40 02/15/2021   LDLCALC 137 (H) 02/15/2021   TRIG 157 (H) 02/15/2021   CHOLHDL 5.1 (H) 02/15/2021   Lab Results  Component Value Date   NA 141 02/15/2021   K 3.5 02/15/2021   CREATININE 1.15 02/15/2021   EGFR 72 02/15/2021   GFRNONAA >90 07/29/2013   GLUCOSE 181 (H) 02/15/2021       Current Outpatient Medications on File Prior to Visit  Medication Sig Dispense Refill   amLODipine-benazepril (LOTREL) 10-40 MG capsule Take 1 capsule by mouth daily. 90 capsule 0   loratadine (CLARITIN) 10 MG tablet Take 10 mg by mouth daily.     metFORMIN (GLUCOPHAGE) 500 MG tablet Take 1  tablet (500 mg total) by mouth 2 (two) times daily with a meal. 180 tablet 3   metoprolol succinate (TOPROL-XL) 25 MG 24 hr tablet Take 1 tablet (25 mg total) by mouth daily. 90 tablet 0   omeprazole (PRILOSEC OTC) 20 MG tablet Take 20 mg by mouth daily.     rosuvastatin (CRESTOR) 10 MG tablet Take 1 tablet (10 mg total) by mouth daily. 90 tablet 0   tadalafil (CIALIS) 5 MG tablet Take 1 tablet (5 mg total) by mouth daily as needed for erectile dysfunction. 30 tablet 2   No current facility-administered medications on file prior to visit.   Past Medical History:  Diagnosis Date   Allergic rhinitis    GERD (gastroesophageal reflux disease)    Hemorrhoids    Hyperlipidemia 2022   not taking statin therapy currently    Hypertension    does not see a  cardiologist   Osteoarthritis    Past Surgical History:  Procedure Laterality Date   APPENDECTOMY     age 41   JOINT REPLACEMENT Right 2015   total knee arthroplasty, Dr Lorin Mercy   KNEE ARTHROPLASTY Right 07/28/2013   Procedure: COMPUTER ASSISTED TOTAL KNEE ARTHROPLASTY;  Surgeon: Marybelle Killings, MD;  Location: [facility];  Service: Orthopedics;  Laterality: Right;  Right Total Knee Arthroplasty   KNEE SURGERY  1976, 1992   right     Family History  Problem Relation Age of Onset   Ovarian cancer Mother    Alzheimer's disease Father    Social History   Socioeconomic History   Marital status: Married    Spouse name: Not on file   Number of children: 3   Years of education: Not on file   Highest education level: Not on file  Occupational History   Not on file  Tobacco Use   Smoking status: Never   Smokeless tobacco: Never  Vaping Use   Vaping Use: Never used  Substance and Sexual Activity   Alcohol use: Yes    Comment: reports beer every now and then   Drug use: No   Sexual activity: Not on file  Other Topics Concern   Not on file  Social History Narrative   Not on file   Social Determinants of Health   Financial Resource Strain: Not on file  Food Insecurity: Not on file  Transportation Needs: Not on file  Physical Activity: Not on file  Stress: Not on file  Social Connections: Not on file    Review of Systems  Constitutional:  Negative for chills, diaphoresis, fatigue and fever.  HENT:  Negative for congestion, ear pain and sore throat.   Respiratory:  Negative for cough and shortness of breath.   Cardiovascular:  Negative for chest pain and leg swelling.  Gastrointestinal:  Negative for abdominal pain, constipation, diarrhea, nausea and vomiting.  Genitourinary:  Negative for dysuria and urgency.  Musculoskeletal:  Negative for arthralgias and myalgias.  Neurological:  Negative for dizziness and headaches.  Psychiatric/Behavioral:  Negative for dysphoric mood.      Objective:  BP 130/84    Pulse 72    Temp 97.7 F (36.5 C)    Resp 16    Ht $R'5\' 10"'DS$  (1.778 m)    Wt 220 lb (99.8 kg)    BMI 31.57 kg/m    BP/Weight 02/15/2021 11/04/2020 62/26/3335  Systolic BP 456 256 389  Diastolic BP 78 78 73  Wt. (Lbs) 232.6 233 -  BMI 34.35 34.41 -  Physical Exam Vitals reviewed.  Constitutional:      Appearance: Normal appearance.  HENT:     Head: Normocephalic.     Right Ear: Tympanic membrane normal.     Left Ear: Tympanic membrane normal.     Nose: Nose normal.     Mouth/Throat:     Mouth: Mucous membranes are moist.  Eyes:     Pupils: Pupils are equal, round, and reactive to light.  Cardiovascular:     Rate and Rhythm: Normal rate and regular rhythm.     Pulses: Normal pulses.     Heart sounds: Normal heart sounds.  Pulmonary:     Effort: Pulmonary effort is normal.     Breath sounds: Normal breath sounds.  Abdominal:     General: Bowel sounds are normal.     Palpations: Abdomen is soft.  Musculoskeletal:        General: Normal range of motion.     Cervical back: Neck supple.  Skin:    General: Skin is warm and dry.     Capillary Refill: Capillary refill takes less than 2 seconds.  Neurological:     General: No focal deficit present.     Mental Status: He is alert and oriented to person, place, and time.  Psychiatric:        Mood and Affect: Mood normal.        Behavior: Behavior normal.        Lab Results  Component Value Date   WBC 7.1 02/15/2021   HGB 15.5 02/15/2021   HCT 44.5 02/15/2021   PLT 223 02/15/2021   GLUCOSE 181 (H) 02/15/2021   CHOL 205 (H) 02/15/2021   TRIG 157 (H) 02/15/2021   HDL 40 02/15/2021   LDLCALC 137 (H) 02/15/2021   ALT 36 02/15/2021   AST 17 02/15/2021   NA 141 02/15/2021   K 3.5 02/15/2021   CL 100 02/15/2021   CREATININE 1.15 02/15/2021   BUN 16 02/15/2021   CO2 25 02/15/2021   TSH 0.498 11/05/2020   INR 1.11 07/18/2013   HGBA1C 8.7 (H) 02/15/2021      Assessment & Plan:   1.  Type 2 diabetes mellitus with hyperglycemia, without long-term current use of insulin (HCC) - Hemoglobin A1c - CBC with Differential/Platelet -continue Metformin 500 BID -recommend diabetic eye exam -recommend diabetes education  2. Essential hypertension-well controlled - Comprehensive metabolic panel -  3. Mixed hyperlipidemia-not at goal - Lipid panel -Pt declines statin therapy  4. Gastroesophageal reflux disease without esophagitis-well controlled -continue Omeprazole 20 mg daily -avoid foods that trigger GERD symptoms   Continue medications We will call you with lab results tomorrow Recommend screening eye exam Follow-up in 13-months, fasting      Follow-up: 64-months, fasting  An After Visit Summary was printed and given to the patient.  I, Rip Harbour, NP, have reviewed all documentation for this visit. The documentation on 09/19/21 for the exam, diagnosis, procedures, and orders are all accurate and complete.    Signed,  Rip Harbour, NP [place] [phone]

## 2021-09-20 LAB — CARDIOVASCULAR RISK ASSESSMENT

## 2021-09-20 LAB — CBC WITH DIFFERENTIAL/PLATELET
Basophils Absolute: 0.1 10*3/uL (ref 0.0–0.2)
Basos: 1 %
EOS (ABSOLUTE): 0.4 10*3/uL (ref 0.0–0.4)
Eos: 4 %
Hematocrit: 46.6 % (ref 37.5–51.0)
Hemoglobin: 16.8 g/dL (ref 13.0–17.7)
Immature Grans (Abs): 0.1 10*3/uL (ref 0.0–0.1)
Immature Granulocytes: 2 %
Lymphocytes Absolute: 2.8 10*3/uL (ref 0.7–3.1)
Lymphs: 34 %
MCH: 33.1 pg — ABNORMAL HIGH (ref 26.6–33.0)
MCHC: 36.1 g/dL — ABNORMAL HIGH (ref 31.5–35.7)
MCV: 92 fL (ref 79–97)
Monocytes Absolute: 0.9 10*3/uL (ref 0.1–0.9)
Monocytes: 11 %
Neutrophils Absolute: 4 10*3/uL (ref 1.4–7.0)
Neutrophils: 48 %
Platelets: 190 10*3/uL (ref 150–450)
RBC: 5.07 x10E6/uL (ref 4.14–5.80)
RDW: 12.7 % (ref 11.6–15.4)
WBC: 8.3 10*3/uL (ref 3.4–10.8)

## 2021-09-20 LAB — COMPREHENSIVE METABOLIC PANEL
ALT: 29 IU/L (ref 0–44)
AST: 23 IU/L (ref 0–40)
Albumin/Globulin Ratio: 1.7 (ref 1.2–2.2)
Albumin: 4.5 g/dL (ref 3.8–4.8)
Alkaline Phosphatase: 81 IU/L (ref 44–121)
BUN/Creatinine Ratio: 12 (ref 10–24)
BUN: 14 mg/dL (ref 8–27)
Bilirubin Total: 0.3 mg/dL (ref 0.0–1.2)
CO2: 26 mmol/L (ref 20–29)
Calcium: 9.6 mg/dL (ref 8.6–10.2)
Chloride: 97 mmol/L (ref 96–106)
Creatinine, Ser: 1.2 mg/dL (ref 0.76–1.27)
Globulin, Total: 2.7 g/dL (ref 1.5–4.5)
Glucose: 122 mg/dL — ABNORMAL HIGH (ref 70–99)
Potassium: 4 mmol/L (ref 3.5–5.2)
Sodium: 139 mmol/L (ref 134–144)
Total Protein: 7.2 g/dL (ref 6.0–8.5)
eGFR: 69 mL/min/{1.73_m2} (ref >59)

## 2021-09-20 LAB — LIPID PANEL
Chol/HDL Ratio: 4.5 ratio (ref 0.0–5.0)
Cholesterol, Total: 229 mg/dL — ABNORMAL HIGH (ref 100–199)
HDL: 51 mg/dL (ref >39)
LDL Chol Calc (NIH): 148 mg/dL — ABNORMAL HIGH (ref 0–99)
Triglycerides: 168 mg/dL — ABNORMAL HIGH (ref 0–149)
VLDL Cholesterol Cal: 30 mg/dL (ref 5–40)

## 2021-09-20 LAB — HEMOGLOBIN A1C
Est. average glucose Bld gHb Est-mCnc: 126 mg/dL
Hgb A1c MFr Bld: 6 % — ABNORMAL HIGH (ref 4.8–5.6)

## 2021-09-27 ENCOUNTER — Other Ambulatory Visit: Payer: Self-pay

## 2021-09-27 DIAGNOSIS — E782 Mixed hyperlipidemia: Secondary | ICD-10-CM

## 2021-09-27 MED ORDER — EZETIMIBE 10 MG PO TABS: 10.0000 mg | ORAL_TABLET | Freq: Every day | ORAL | 3 refills | Status: DC

## 2021-10-05 ENCOUNTER — Telehealth: Payer: Self-pay

## 2021-10-05 NOTE — Telephone Encounter (Signed)
I left a message on the number(s) listed in the patients chart requesting the patient to call back regarding the upcomming appointment for 12/19/2021. The provider is out of the office that day. The appointment has been canceled. Waiting for the patient to return the call. ° ° °

## 2021-10-09 ENCOUNTER — Other Ambulatory Visit: Payer: Self-pay | Admitting: Nurse Practitioner

## 2021-10-29 ENCOUNTER — Other Ambulatory Visit: Payer: Self-pay | Admitting: Nurse Practitioner

## 2021-10-29 DIAGNOSIS — N401 Enlarged prostate with lower urinary tract symptoms: Secondary | ICD-10-CM

## 2021-11-24 ENCOUNTER — Other Ambulatory Visit: Payer: Self-pay | Admitting: Nurse Practitioner

## 2021-11-24 DIAGNOSIS — I10 Essential (primary) hypertension: Secondary | ICD-10-CM

## 2021-12-08 ENCOUNTER — Other Ambulatory Visit: Payer: Self-pay | Admitting: Nurse Practitioner

## 2021-12-08 DIAGNOSIS — N529 Male erectile dysfunction, unspecified: Secondary | ICD-10-CM

## 2021-12-19 ENCOUNTER — Ambulatory Visit: Payer: BC Managed Care – PPO | Admitting: Nurse Practitioner

## 2021-12-21 ENCOUNTER — Other Ambulatory Visit: Payer: Self-pay | Admitting: Nurse Practitioner

## 2021-12-21 DIAGNOSIS — I10 Essential (primary) hypertension: Secondary | ICD-10-CM

## 2022-02-15 ENCOUNTER — Other Ambulatory Visit: Payer: Self-pay | Admitting: Nurse Practitioner

## 2022-03-01 ENCOUNTER — Other Ambulatory Visit: Payer: Self-pay | Admitting: Nurse Practitioner

## 2022-03-01 DIAGNOSIS — N529 Male erectile dysfunction, unspecified: Secondary | ICD-10-CM

## 2022-03-02 ENCOUNTER — Other Ambulatory Visit: Payer: Self-pay | Admitting: Nurse Practitioner

## 2022-03-02 DIAGNOSIS — E1165 Type 2 diabetes mellitus with hyperglycemia: Secondary | ICD-10-CM

## 2022-03-19 ENCOUNTER — Other Ambulatory Visit: Payer: Self-pay | Admitting: Nurse Practitioner

## 2022-03-27 ENCOUNTER — Other Ambulatory Visit: Payer: Self-pay | Admitting: Nurse Practitioner

## 2022-03-27 DIAGNOSIS — E1165 Type 2 diabetes mellitus with hyperglycemia: Secondary | ICD-10-CM

## 2022-03-28 ENCOUNTER — Other Ambulatory Visit: Payer: Self-pay

## 2022-03-28 DIAGNOSIS — I10 Essential (primary) hypertension: Secondary | ICD-10-CM

## 2022-03-28 DIAGNOSIS — N529 Male erectile dysfunction, unspecified: Secondary | ICD-10-CM

## 2022-03-28 MED ORDER — AMLODIPINE BESY-BENAZEPRIL HCL 10-40 MG PO CAPS: 1.0000 | ORAL_CAPSULE | Freq: Every day | ORAL | 0 refills | Status: DC

## 2022-03-28 MED ORDER — TADALAFIL 10 MG PO TABS: 10.0000 mg | ORAL_TABLET | Freq: Every day | ORAL | 2 refills | Status: DC | PRN

## 2022-03-28 MED ORDER — METOPROLOL SUCCINATE ER 25 MG PO TB24: 25.0000 mg | ORAL_TABLET | Freq: Every day | ORAL | 0 refills | Status: DC

## 2022-03-28 NOTE — Telephone Encounter (Signed)
Patient called requesting refills for his metoprolol,amlodipine, and tadalifil. Patient is requesting enough before his appointment which is scheduled to see you on August 3rd. Patient states he is completely out of these.

## 2022-04-06 ENCOUNTER — Other Ambulatory Visit: Payer: Self-pay | Admitting: Nurse Practitioner

## 2022-04-06 ENCOUNTER — Encounter: Payer: Self-pay | Admitting: Nurse Practitioner

## 2022-04-06 ENCOUNTER — Ambulatory Visit: Payer: BC Managed Care – PPO | Admitting: Nurse Practitioner

## 2022-04-06 VITALS — BP 124/76 | HR 88 | Temp 97.0°F | Ht 70.0 in | Wt 221.0 lb

## 2022-04-06 DIAGNOSIS — R350 Frequency of micturition: Secondary | ICD-10-CM

## 2022-04-06 DIAGNOSIS — E1165 Type 2 diabetes mellitus with hyperglycemia: Secondary | ICD-10-CM | POA: Diagnosis not present

## 2022-04-06 DIAGNOSIS — Z5320 Procedure and treatment not carried out because of patient's decision for unspecified reasons: Secondary | ICD-10-CM

## 2022-04-06 DIAGNOSIS — Z125 Encounter for screening for malignant neoplasm of prostate: Secondary | ICD-10-CM | POA: Diagnosis not present

## 2022-04-06 DIAGNOSIS — I10 Essential (primary) hypertension: Secondary | ICD-10-CM | POA: Diagnosis not present

## 2022-04-06 DIAGNOSIS — N529 Male erectile dysfunction, unspecified: Secondary | ICD-10-CM

## 2022-04-06 DIAGNOSIS — E782 Mixed hyperlipidemia: Secondary | ICD-10-CM

## 2022-04-06 DIAGNOSIS — N401 Enlarged prostate with lower urinary tract symptoms: Secondary | ICD-10-CM

## 2022-04-06 DIAGNOSIS — K219 Gastro-esophageal reflux disease without esophagitis: Secondary | ICD-10-CM | POA: Diagnosis not present

## 2022-04-06 MED ORDER — TADALAFIL 5 MG PO TABS: 5.0000 mg | ORAL_TABLET | Freq: Every day | ORAL | 1 refills | Status: DC

## 2022-04-06 MED ORDER — OMEPRAZOLE MAGNESIUM 20 MG PO TBEC: 20.0000 mg | DELAYED_RELEASE_TABLET | Freq: Every day | ORAL | 1 refills | Status: DC

## 2022-04-06 MED ORDER — EZETIMIBE 10 MG PO TABS: 10.0000 mg | ORAL_TABLET | Freq: Every day | ORAL | 3 refills | Status: DC

## 2022-04-06 MED ORDER — METFORMIN HCL 500 MG PO TABS: 500.0000 mg | ORAL_TABLET | Freq: Two times a day (BID) | ORAL | 1 refills | Status: DC

## 2022-04-06 MED ORDER — TADALAFIL 10 MG PO TABS: 10.0000 mg | ORAL_TABLET | Freq: Every day | ORAL | 2 refills | Status: DC

## 2022-04-06 MED ORDER — AMLODIPINE BESY-BENAZEPRIL HCL 10-40 MG PO CAPS
1.0000 | ORAL_CAPSULE | Freq: Every day | ORAL | 0 refills | Status: DC
Start: 2022-04-06 — End: 2022-07-24

## 2022-04-06 MED ORDER — LORATADINE 10 MG PO TABS: 10.0000 mg | ORAL_TABLET | Freq: Every day | ORAL | 2 refills | Status: DC

## 2022-04-06 MED ORDER — METOPROLOL SUCCINATE ER 25 MG PO TB24: 25.0000 mg | ORAL_TABLET | Freq: Every day | ORAL | 0 refills | Status: DC

## 2022-04-06 NOTE — Patient Instructions (Addendum)
We will call you with lab results Continue medications Increase Cialis to 10 mg daily for BPH Follow-up in 25-months Benign Prostatic Hyperplasia  Benign prostatic hyperplasia (BPH) is an enlarged prostate gland that is caused by the normal aging process. The prostate may get bigger as a man gets older. The condition is not caused by cancer. The prostate is a walnut-sized gland that is involved in the production of semen. It is located in front of the rectum and below the bladder. The bladder stores urine. The urethra carries stored urine out of the body. An enlarged prostate can press on the urethra. This can make it harder to pass urine. The buildup of urine in the bladder can cause infection. Back pressure and infection may progress to bladder damage and kidney (renal) failure. What are the causes? This condition is part of the normal aging process. However, not all men develop problems from this condition. If the prostate enlarges away from the urethra, urine flow will not be blocked. If it enlarges toward the urethra and compresses it, there will be problems passing urine. What increases the risk? This condition is more likely to develop in men older than 50 years. What are the signs or symptoms? Symptoms of this condition include: Getting up often during the night to urinate. Needing to urinate frequently during the day. Difficulty starting urine flow. Decrease in size and strength of your urine stream. Leaking (dribbling) after urinating. Inability to pass urine. This needs immediate treatment. Inability to completely empty your bladder. Pain when you pass urine. This is more common if there is also an infection. Urinary tract infection (UTI). How is this diagnosed? This condition is diagnosed based on your medical history, a physical exam, and your symptoms. Tests will also be done, such as: A post-void bladder scan. This measures any amount of urine that may remain in your bladder  after you finish urinating. A digital rectal exam. In a rectal exam, your health care provider checks your prostate by putting a lubricated, gloved finger into your rectum to feel the back of your prostate gland. This exam detects the size of your gland and any abnormal lumps or growths. An exam of your urine (urinalysis). A prostate specific antigen (PSA) screening. This is a blood test used to screen for prostate cancer. An ultrasound. This test uses sound waves to electronically produce a picture of your prostate gland. Your health care provider may refer you to a specialist in kidney and prostate diseases (urologist). How is this treated? Once symptoms begin, your health care provider will monitor your condition (active surveillance or watchful waiting). Treatment for this condition will depend on the severity of your condition. Treatment may include: Observation and yearly exams. This may be the only treatment needed if your condition and symptoms are mild. Medicines to relieve your symptoms, including: Medicines to shrink the prostate. Medicines to relax the muscle of the prostate. Surgery in severe cases. Surgery may include: Prostatectomy. In this procedure, the prostate tissue is removed completely through an open incision or with a laparoscope or robotics. Transurethral resection of the prostate (TURP). In this procedure, a tool is inserted through the opening at the tip of the penis (urethra). It is used to cut away tissue of the inner core of the prostate. The pieces are removed through the same opening of the penis. This removes the blockage. Transurethral incision (TUIP). In this procedure, small cuts are made in the prostate. This lessens the prostate's pressure on the urethra.  Transurethral microwave thermotherapy (TUMT). This procedure uses microwaves to create heat. The heat destroys and removes a small amount of prostate tissue. Transurethral needle ablation (TUNA). This procedure  uses radio frequencies to destroy and remove a small amount of prostate tissue. Interstitial laser coagulation (ILC). This procedure uses a laser to destroy and remove a small amount of prostate tissue. Transurethral electrovaporization (TUVP). This procedure uses electrodes to destroy and remove a small amount of prostate tissue. Prostatic urethral lift. This procedure inserts an implant to push the lobes of the prostate away from the urethra. Follow these instructions at home: Take over-the-counter and prescription medicines only as told by your health care provider. Monitor your symptoms for any changes. Contact your health care provider with any changes. Avoid drinking large amounts of liquid before going to bed or out in public. Avoid or reduce how much caffeine or alcohol you drink. Give yourself time when you urinate. Keep all follow-up visits. This is important. Contact a health care provider if: You have unexplained back pain. Your symptoms do not get better with treatment. You develop side effects from the medicine you are taking. Your urine becomes very dark or has a bad smell. Your lower abdomen becomes distended and you have trouble passing urine. Get help right away if: You have a fever or chills. You suddenly cannot urinate. You feel light-headed or very dizzy, or you faint. There are large amounts of blood or clots in your urine. Your urinary problems become hard to manage. You develop moderate to severe low back or flank pain. The flank is the side of your body between the ribs and the hip. These symptoms may be an emergency. Get help right away. Call 911. Do not wait to see if the symptoms will go away. Do not drive yourself to the hospital. Summary Benign prostatic hyperplasia (BPH) is an enlarged prostate that is caused by the normal aging process. It is not caused by cancer. An enlarged prostate can press on the urethra. This can make it hard to pass urine. This  condition is more likely to develop in men older than 50 years. Get help right away if you suddenly cannot urinate. This information is not intended to replace advice given to you by your health care provider. Make sure you discuss any questions you have with your health care provider. Document Revised: 03/09/2021 Document Reviewed: 03/09/2021 Elsevier Patient Education  2023 Else[employer] Inc.  Diabetes Mellitus and Foot Care Foot care is an important part of your health, especially when you have diabetes. Diabetes may cause you to have problems because of poor blood flow (circulation) to your feet and legs, which can cause your skin to: Become thinner and drier. Break more easily. Heal more slowly. Peel and crack. You may also have nerve damage (neuropathy) in your legs and feet, causing decreased feeling in them. This means that you may not notice minor injuries to your feet that could lead to more serious problems. Noticing and addressing any potential problems early is the best way to prevent future foot problems. How to care for your feet Foot hygiene  Wash your feet daily with warm water and mild soap. Do not use hot water. Then, pat your feet and the areas between your toes until they are completely dry. Do not soak your feet as this can dry your skin. Trim your toenails straight across. Do not dig under them or around the cuticle. File the edges of your nails with an emery board or  nail file. Apply a moisturizing lotion or petroleum jelly to the skin on your feet and to dry, brittle toenails. Use lotion that does not contain alcohol and is unscented. Do not apply lotion between your toes. Shoes and socks Wear clean socks or stockings every day. Make sure they are not too tight. Do not wear knee-high stockings since they may decrease blood flow to your legs. Wear shoes that fit properly and have enough cushioning. Always look in your shoes before you put them on to be sure there are no  objects inside. To break in new shoes, wear them for just a few hours a day. This prevents injuries on your feet. Wounds, scrapes, corns, and calluses  Check your feet daily for blisters, cuts, bruises, sores, and redness. If you cannot see the bottom of your feet, use a mirror or ask someone for help. Do not cut corns or calluses or try to remove them with medicine. If you find a minor scrape, cut, or break in the skin on your feet, keep it and the skin around it clean and dry. You may clean these areas with mild soap and water. Do not clean the area with peroxide, alcohol, or iodine. If you have a wound, scrape, corn, or callus on your foot, look at it several times a day to make sure it is healing and not infected. Check for: Redness, swelling, or pain. Fluid or blood. Warmth. Pus or a bad smell. General tips Do not cross your legs. This may decrease blood flow to your feet. Do not use heating pads or hot water bottles on your feet. They may burn your skin. If you have lost feeling in your feet or legs, you may not know this is happening until it is too late. Protect your feet from hot and cold by wearing shoes, such as at the beach or on hot pavement. Schedule a complete foot exam at least once a year (annually) or more often if you have foot problems. Report any cuts, sores, or bruises to your health care provider immediately. Where to find more information American Diabetes Association: www.diabetes.org Association of Diabetes Care & Education Specialists: www.diabeteseducator.org Contact a health care provider if: You have a medical condition that increases your risk of infection and you have any cuts, sores, or bruises on your feet. You have an injury that is not healing. You have redness on your legs or feet. You feel burning or tingling in your legs or feet. You have pain or cramps in your legs and feet. Your legs or feet are numb. Your feet always feel cold. You have pain  around any toenails. Get help right away if: You have a wound, scrape, corn, or callus on your foot and: You have pain, swelling, or redness that gets worse. You have fluid or blood coming from the wound, scrape, corn, or callus. Your wound, scrape, corn, or callus feels warm to the touch. You have pus or a bad smell coming from the wound, scrape, corn, or callus. You have a fever. You have a red line going up your leg. Summary Check your feet every day for blisters, cuts, bruises, sores, and redness. Apply a moisturizing lotion or petroleum jelly to the skin on your feet and to dry, brittle toenails. Wear shoes that fit properly and have enough cushioning. If you have foot problems, report any cuts, sores, or bruises to your health care provider immediately. Schedule a complete foot exam at least once a year (annually)  or more often if you have foot problems. This information is not intended to replace advice given to you by your health care provider. Make sure you discuss any questions you have with your health care provider. Document Revised: 03/11/2020 Document Reviewed: 03/11/2020 Elsevier Patient Education  2023 Elsevier Inc.   Preventing High Cholesterol Cholesterol is a white, waxy substance similar to fat that the human body needs to help build cells. The liver makes all the cholesterol that a person's body needs. Having high cholesterol (hypercholesterolemia) increases your risk for heart disease and stroke. Extra or excess cholesterol comes from the food that you eat. High cholesterol can often be prevented with diet and lifestyle changes. If you already have high cholesterol, you can control it with diet, lifestyle changes, and medicines. How can high cholesterol affect me? If you have high cholesterol, fatty deposits (plaques) may build up on the walls of your blood vessels. The blood vessels that carry blood away from your heart are called arteries. Plaques make the arteries  narrower and stiffer. This in turn can: Restrict or block blood flow and cause blood clots to form. Increase your risk for heart attack and stroke. What can increase my risk for high cholesterol? This condition is more likely to develop in people who: Eat foods that are high in saturated fat or cholesterol. Saturated fat is mostly found in foods that come from animal sources. Are overweight. Are not getting enough exercise. Use products that contain nicotine or tobacco, such as cigarettes, e-cigarettes, and chewing tobacco. Have a family history of high cholesterol (familial hypercholesterolemia). What actions can I take to prevent this? Nutrition  Eat less saturated fat. Avoid trans fats (partially hydrogenated oils). These are often found in margarine and in some baked goods, fried foods, and snacks bought in packages. Avoid precooked or cured meat, such as bacon, sausages, or meat loaves. Avoid foods and drinks that have added sugars. Eat more fruits, vegetables, and whole grains. Choose healthy sources of protein, such as fish, poultry, lean cuts of red meat, beans, peas, lentils, and nuts. Choose healthy sources of fat, such as: Nuts. Vegetable oils, especially olive oil. Fish that have healthy fats, such as omega-3 fatty acids. These fish include mackerel or salmon. Lifestyle Lose weight if you are overweight. Maintaining a healthy body mass index (BMI) can help prevent or control high cholesterol. It can also lower your risk for diabetes and high blood pressure. Ask your health care provider to help you with a diet and exercise plan to lose weight safely. Do not use any products that contain nicotine or tobacco. These products include cigarettes, chewing tobacco, and vaping devices, such as e-cigarettes. If you need help quitting, ask your health care provider. Alcohol use Do not drink alcohol if: Your health care provider tells you not to drink. You are pregnant, may be pregnant,  or are planning to become pregnant. If you drink alcohol: Limit how much you have to: 0-1 drink a day for women. 0-2 drinks a day for men. Know how much alcohol is in your drink. In the [place] one drink equals one 12 oz bottle of beer (355 mL), one 5 oz glass of wine (148 mL), or one 1 oz glass of hard liquor (44 mL). Activity  Get enough exercise. Do exercises as told by your health care provider. Each week, do at least 150 minutes of exercise that takes a medium level of effort (moderate-intensity exercise). This kind of exercise: Makes your heart beat faster  while allowing you to still be able to talk. Can be done in short sessions several times a day or longer sessions a few times a week. For example, on 5 days each week, you could walk fast or ride your bike 3 times a day for 10 minutes each time. Medicines Your health care provider may recommend medicines to help lower cholesterol. This may be a medicine to lower the amount of cholesterol that your liver makes. You may need medicine if: Diet and lifestyle changes have not lowered your cholesterol enough. You have high cholesterol and other risk factors for heart disease or stroke. Take over-the-counter and prescription medicines only as told by your health care provider. General information Manage your risk factors for high cholesterol. Talk with your health care provider about all your risk factors and how to lower your risk. Manage other conditions that you have, such as diabetes or high blood pressure (hypertension). Have blood tests to check your cholesterol levels at regular points in time as told by your health care provider. Keep all follow-up visits. This is important. Where to find more information American Heart Association: www.heart.org National Heart, Lung, and Blood Institute: [employer].is Summary High cholesterol increases your risk for heart disease and stroke. By keeping your cholesterol level low, you can reduce  your risk for these conditions. High cholesterol can often be prevented with diet and lifestyle changes. Work with your health care provider to manage your risk factors, and have your blood tested regularly. This information is not intended to replace advice given to you by your health care provider. Make sure you discuss any questions you have with your health care provider. Document Revised: 10/25/2020 Document Reviewed: 10/25/2020 Elsevier Patient Education  2023 [employer].

## 2022-04-06 NOTE — Progress Notes (Signed)
Subjective:  Patient ID: Gregory Marks, male    DOB: 24-Apr-1961  Age: 61 y.o. MRN: [id number]  Chief Complaint  Patient presents with   Diabetes   Hypertension   HPI: Pt presents for follow-up of HTN, Type 2 DM, GERD, and hyperlipidemia.Pt reports he has frequent urination, nocturia, intermittent decreased urine stream. States he gets up 3-4 times nightly, symptoms have interfered with sleep and overall energy level.   Hypertension, follow-up:  He was last seen for hypertension 6 months ago.  BP at that visit was 130/84. Management includes Metoprolol, Lotrel.  He reports excellent compliance with treatment. He is not having side effects.  He is following a Regular diet. He is not exercising. He does not smoke.  Pertinent labs: Lab Results  Component Value Date   CHOL 229 (H) 09/19/2021   HDL 51 09/19/2021   LDLCALC 148 (H) 09/19/2021   TRIG 168 (H) 09/19/2021   CHOLHDL 4.5 09/19/2021   Lab Results  Component Value Date   NA 139 09/19/2021   K 4.0 09/19/2021   CREATININE 1.20 09/19/2021   EGFR 69 09/19/2021   GFRNONAA >90 07/29/2013   GLUCOSE 122 (H) 09/19/2021      GERD, Follow up:  The patient was last seen for GERD 3 months ago. Current treatment consist of: Prilosec          He reports excellent compliance with treatment. He is not having side effects. .  Lipid/Cholesterol, Follow-up  Last lipid panel Other pertinent labs  Lab Results  Component Value Date   CHOL 229 (H) 09/19/2021   HDL 51 09/19/2021   LDLCALC 148 (H) 09/19/2021   TRIG 168 (H) 09/19/2021   CHOLHDL 4.5 09/19/2021   Lab Results  Component Value Date   ALT 29 09/19/2021   AST 23 09/19/2021   PLT 190 09/19/2021   TSH 0.498 11/05/2020      Management includes zetia. He reports excellent compliance with treatment. He is not having side effects.     Current Outpatient Medications on File Prior to Visit  Medication Sig Dispense Refill   amLODipine-benazepril (LOTREL) 10-40 MG  capsule Take 1 capsule by mouth daily. 30 capsule 0   ezetimibe (ZETIA) 10 MG tablet Take 1 tablet (10 mg total) by mouth daily. 90 tablet 3   loratadine (CLARITIN) 10 MG tablet Take 10 mg by mouth daily.     metFORMIN (GLUCOPHAGE) 500 MG tablet TAKE 1 TABLET BY MOUTH TWICE DAILY WITH MEALS 180 tablet 0   metFORMIN (GLUCOPHAGE) 500 MG tablet TAKE 1 TABLET BY MOUTH TWICE DAILY WITH MEALS 180 tablet 0   metoprolol succinate (TOPROL-XL) 25 MG 24 hr tablet Take 1 tablet (25 mg total) by mouth daily. 90 tablet 0   omeprazole (PRILOSEC OTC) 20 MG tablet Take 20 mg by mouth daily.     tadalafil (CIALIS) 10 MG tablet Take 1 tablet (10 mg total) by mouth daily as needed for erectile dysfunction. 20 tablet 2   No current facility-administered medications on file prior to visit.   Past Medical History:  Diagnosis Date   Allergic rhinitis    GERD (gastroesophageal reflux disease)    Hemorrhoids    Hyperlipidemia 2022   not taking statin therapy currently    Hypertension    does not see a cardiologist   Osteoarthritis    Past Surgical History:  Procedure Laterality Date   APPENDECTOMY     age 57   JOINT REPLACEMENT Right 2015   total knee arthroplasty,  Dr Lorin Mercy   KNEE ARTHROPLASTY Right 07/28/2013   Procedure: COMPUTER ASSISTED TOTAL KNEE ARTHROPLASTY;  Surgeon: Marybelle Killings, MD;  Location: [facility];  Service: Orthopedics;  Laterality: Right;  Right Total Knee Arthroplasty   KNEE SURGERY  1976, 1992   right     Family History  Problem Relation Age of Onset   Ovarian cancer Mother    Alzheimer's disease Father    Social History   Socioeconomic History   Marital status: Married    Spouse name: Not on file   Number of children: 3   Years of education: Not on file   Highest education level: Not on file  Occupational History   Not on file  Tobacco Use   Smoking status: Never   Smokeless tobacco: Never  Vaping Use   Vaping Use: Never used  Substance and Sexual Activity   Alcohol use:  Yes    Comment: reports beer every now and then   Drug use: No   Sexual activity: Not on file  Other Topics Concern   Not on file  Social History Narrative   Not on file   Social Determinants of Health   Financial Resource Strain: Not on file  Food Insecurity: Not on file  Transportation Needs: Not on file  Physical Activity: Not on file  Stress: Not on file  Social Connections: Not on file    Review of Systems  Constitutional:  Negative for chills, diaphoresis, fatigue and fever.  HENT:  Negative for congestion, ear pain and sore throat.   Respiratory:  Negative for cough and shortness of breath.   Cardiovascular:  Negative for chest pain and leg swelling.  Gastrointestinal:  Negative for abdominal pain, constipation, diarrhea, nausea and vomiting.  Endocrine: Positive for polyuria (nocturia). Negative for polydipsia and polyphagia.  Genitourinary:  Negative for dysuria and urgency.  Musculoskeletal:  Negative for arthralgias and myalgias.  Neurological:  Negative for dizziness and headaches.  Psychiatric/Behavioral:  Negative for dysphoric mood.      Objective:  BP 124/76   Pulse 88   Temp (!) 97 F (36.1 C)   Ht _0  (1.778 m)   Wt 221 lb (100.2 kg)   SpO2 99%   BMI 31.71 kg/m       04/06/2022    8:18 AM 09/19/2021    8:31 AM 02/15/2021    7:52 AM  BP/Weight  Systolic BP  262 035  Diastolic BP  84 78  Wt. (Lbs) 221 220 232.6  BMI 31.71 kg/m2 31.57 kg/m2 34.35 kg/m2    Physical Exam Constitutional:      Appearance: Normal appearance.  Neck:     Vascular: No carotid bruit.  Cardiovascular:     Rate and Rhythm: Normal rate and regular rhythm.     Pulses: Normal pulses.     Heart sounds: Normal heart sounds. No murmur heard. Pulmonary:     Effort: Pulmonary effort is normal.     Breath sounds: Normal breath sounds.  Abdominal:     General: Bowel sounds are normal.     Palpations: Abdomen is soft. There is no mass.     Tenderness: There is no  abdominal tenderness.  Musculoskeletal:        General: No swelling.  Neurological:     Mental Status: He is alert and oriented to person, place, and time.  Psychiatric:        Mood and Affect: Mood normal.        Behavior: Behavior  normal.     Lab Results  Component Value Date   WBC 8.3 09/19/2021   HGB 16.8 09/19/2021   HCT 46.6 09/19/2021   PLT 190 09/19/2021   GLUCOSE 122 (H) 09/19/2021   CHOL 229 (H) 09/19/2021   TRIG 168 (H) 09/19/2021   HDL 51 09/19/2021   LDLCALC 148 (H) 09/19/2021   ALT 29 09/19/2021   AST 23 09/19/2021   NA 139 09/19/2021   K 4.0 09/19/2021   CL 97 09/19/2021   CREATININE 1.20 09/19/2021   BUN 14 09/19/2021   CO2 26 09/19/2021   TSH 0.498 11/05/2020   INR 1.11 07/18/2013   HGBA1C 6.0 (H) 09/19/2021      Assessment & Plan:   1. Mixed hyperlipidemia-not at goal - Lipid panel -continue Zetia -continue heart healthy diet  2. Type 2 diabetes mellitus with hyperglycemia, without long-term current use of insulin (HCC)-well controlled - CBC with Differential/Platelet - Hemoglobin A1c - Microalbumin / creatinine urine ratio  3. Essential hypertension-well controlled - Comprehensive metabolic panel -CBC -continue Lotrel 10-40   4. Gastroesophageal reflux disease- well controlled - CBC with Differential/Platelet - Comprehensive metabolic panel -continue Prilosec 20 mg OTC -avoid foods that trigger GERD  5. Erectile dysfunction, unspecified erectile dysfunction type - CBC with Differential/Platelet - Comprehensive metabolic panel  6. Prostate cancer screening - PSA  7. Benign prostatic hyperplasia with urinary frequency -Cialis 10 mg QD - PSA  8. Statin declined    We will call you with lab results Continue medications Increase Cialis to 10 mg daily for BPH Follow-up in 75-month    Follow-up: 655-month fasting  An After Visit Summary was printed and given to the patient.  I, ShRip HarbourNP, have reviewed all  documentation for this visit. The documentation on 04/06/22 for the exam, diagnosis, procedures, and orders are all accurate and complete.    Signed, ShRip HarbourNP [phone]

## 2022-04-07 LAB — CBC WITH DIFFERENTIAL/PLATELET
Basophils Absolute: 0.1 10*3/uL (ref 0.0–0.2)
Basos: 1 %
EOS (ABSOLUTE): 0.4 10*3/uL (ref 0.0–0.4)
Eos: 5 %
Hematocrit: 45.3 % (ref 37.5–51.0)
Hemoglobin: 15.9 g/dL (ref 13.0–17.7)
Immature Grans (Abs): 0.1 10*3/uL (ref 0.0–0.1)
Immature Granulocytes: 1 %
Lymphocytes Absolute: 2.5 10*3/uL (ref 0.7–3.1)
Lymphs: 32 %
MCH: 33 pg (ref 26.6–33.0)
MCHC: 35.1 g/dL (ref 31.5–35.7)
MCV: 94 fL (ref 79–97)
Monocytes Absolute: 0.8 10*3/uL (ref 0.1–0.9)
Monocytes: 10 %
Neutrophils Absolute: 3.9 10*3/uL (ref 1.4–7.0)
Neutrophils: 51 %
Platelets: 221 10*3/uL (ref 150–450)
RBC: 4.82 x10E6/uL (ref 4.14–5.80)
RDW: 13 % (ref 11.6–15.4)
WBC: 7.7 10*3/uL (ref 3.4–10.8)

## 2022-04-07 LAB — PSA: Prostate Specific Ag, Serum: 2 ng/mL (ref 0.0–4.0)

## 2022-04-07 LAB — COMPREHENSIVE METABOLIC PANEL
ALT: 45 IU/L — ABNORMAL HIGH (ref 0–44)
AST: 32 IU/L (ref 0–40)
Albumin/Globulin Ratio: 1.7 (ref 1.2–2.2)
Albumin: 4.4 g/dL (ref 3.9–4.9)
Alkaline Phosphatase: 70 IU/L (ref 44–121)
BUN/Creatinine Ratio: 11 (ref 10–24)
BUN: 13 mg/dL (ref 8–27)
Bilirubin Total: 0.4 mg/dL (ref 0.0–1.2)
CO2: 27 mmol/L (ref 20–29)
Calcium: 9.5 mg/dL (ref 8.6–10.2)
Chloride: 97 mmol/L (ref 96–106)
Creatinine, Ser: 1.15 mg/dL (ref 0.76–1.27)
Globulin, Total: 2.6 g/dL (ref 1.5–4.5)
Glucose: 143 mg/dL — ABNORMAL HIGH (ref 70–99)
Potassium: 3.7 mmol/L (ref 3.5–5.2)
Sodium: 140 mmol/L (ref 134–144)
Total Protein: 7 g/dL (ref 6.0–8.5)
eGFR: 72 mL/min/{1.73_m2} (ref >59)

## 2022-04-07 LAB — MICROALBUMIN / CREATININE URINE RATIO
Creatinine, Urine: 145.1 mg/dL
Microalb/Creat Ratio: 812 mg/g creat — ABNORMAL HIGH (ref 0–29)
Microalbumin, Urine: 1177.6 ug/mL

## 2022-04-07 LAB — LIPID PANEL
Chol/HDL Ratio: 4.7 ratio (ref 0.0–5.0)
Cholesterol, Total: 210 mg/dL — ABNORMAL HIGH (ref 100–199)
HDL: 45 mg/dL (ref >39)
LDL Chol Calc (NIH): 136 mg/dL — ABNORMAL HIGH (ref 0–99)
Triglycerides: 164 mg/dL — ABNORMAL HIGH (ref 0–149)
VLDL Cholesterol Cal: 29 mg/dL (ref 5–40)

## 2022-04-07 LAB — HEMOGLOBIN A1C
Est. average glucose Bld gHb Est-mCnc: 137 mg/dL
Hgb A1c MFr Bld: 6.4 % — ABNORMAL HIGH (ref 4.8–5.6)

## 2022-04-07 LAB — CARDIOVASCULAR RISK ASSESSMENT

## 2022-04-10 ENCOUNTER — Other Ambulatory Visit: Payer: Self-pay

## 2022-04-10 ENCOUNTER — Telehealth: Payer: Self-pay

## 2022-04-10 MED ORDER — ICOSAPENT ETHYL 1 G PO CAPS: 2.0000 g | ORAL_CAPSULE | Freq: Two times a day (BID) | ORAL | 1 refills | Status: DC

## 2022-04-10 MED ORDER — KERENDIA 10 MG PO TABS: 1.0000 | ORAL_TABLET | Freq: Every day | ORAL | 1 refills | Status: DC

## 2022-04-10 NOTE — Telephone Encounter (Signed)
PA submitted and approved via covermymeds for [employer].

## 2022-06-25 ENCOUNTER — Other Ambulatory Visit: Payer: Self-pay | Admitting: Nurse Practitioner

## 2022-06-25 DIAGNOSIS — I10 Essential (primary) hypertension: Secondary | ICD-10-CM

## 2022-07-04 DIAGNOSIS — S161XXA Strain of muscle, fascia and tendon at neck level, initial encounter: Secondary | ICD-10-CM | POA: Diagnosis not present

## 2022-07-07 ENCOUNTER — Ambulatory Visit: Payer: BC Managed Care – PPO | Admitting: Nurse Practitioner

## 2022-07-23 ENCOUNTER — Other Ambulatory Visit: Payer: Self-pay | Admitting: Nurse Practitioner

## 2022-07-24 ENCOUNTER — Other Ambulatory Visit: Payer: Self-pay | Admitting: Nurse Practitioner

## 2022-08-21 ENCOUNTER — Other Ambulatory Visit: Payer: Self-pay

## 2022-08-21 DIAGNOSIS — N401 Enlarged prostate with lower urinary tract symptoms: Secondary | ICD-10-CM

## 2022-08-21 DIAGNOSIS — E1165 Type 2 diabetes mellitus with hyperglycemia: Secondary | ICD-10-CM

## 2022-08-21 DIAGNOSIS — N529 Male erectile dysfunction, unspecified: Secondary | ICD-10-CM

## 2022-08-21 MED ORDER — METFORMIN HCL 500 MG PO TABS: 500.0000 mg | ORAL_TABLET | Freq: Two times a day (BID) | ORAL | 1 refills | Status: DC

## 2022-08-21 MED ORDER — TADALAFIL 10 MG PO TABS: 10.0000 mg | ORAL_TABLET | Freq: Every day | ORAL | 0 refills | Status: DC

## 2022-09-17 ENCOUNTER — Other Ambulatory Visit: Payer: Self-pay | Admitting: Nurse Practitioner

## 2022-09-17 DIAGNOSIS — N401 Enlarged prostate with lower urinary tract symptoms: Secondary | ICD-10-CM

## 2022-09-17 DIAGNOSIS — N529 Male erectile dysfunction, unspecified: Secondary | ICD-10-CM

## 2022-10-10 DIAGNOSIS — K219 Gastro-esophageal reflux disease without esophagitis: Secondary | ICD-10-CM | POA: Insufficient documentation

## 2022-10-10 DIAGNOSIS — N401 Enlarged prostate with lower urinary tract symptoms: Secondary | ICD-10-CM | POA: Insufficient documentation

## 2022-10-10 DIAGNOSIS — N529 Male erectile dysfunction, unspecified: Secondary | ICD-10-CM | POA: Insufficient documentation

## 2022-10-10 DIAGNOSIS — I10 Essential (primary) hypertension: Secondary | ICD-10-CM | POA: Insufficient documentation

## 2022-10-10 DIAGNOSIS — R350 Frequency of micturition: Secondary | ICD-10-CM | POA: Insufficient documentation

## 2022-10-10 DIAGNOSIS — E1165 Type 2 diabetes mellitus with hyperglycemia: Secondary | ICD-10-CM | POA: Insufficient documentation

## 2022-10-10 DIAGNOSIS — E782 Mixed hyperlipidemia: Secondary | ICD-10-CM | POA: Insufficient documentation

## 2022-10-10 NOTE — Progress Notes (Unsigned)
Subjective:  Patient ID: Gregory Marks, male    DOB: 1961-04-04  Age: 62 y.o. MRN: [id number]  Chief Complaints: Chronic Follow up  History of Present illness:  Patient is here for the follow up for DM2, hyperlipidemia and hypertension. He works in a [place] and he has to deal with many customers and employees. His job is stressful. He smokes half pack everyday and drinks occasionally. He mentioned he does not have any desire to quit smoking. He is not watching his diet strictly but trying to eat healthy.  Diabetes Mellitus Type II, follow-up  Lab Results  Component Value Date   HGBA1C 6.4 (H) 04/06/2022   HGBA1C 6.0 (H) 09/19/2021   HGBA1C 8.7 (H) 02/15/2021   Last seen for diabetes 6 months ago.  Management since then includes metformin 500 mg BD He reports excellent compliance with treatment. He is not having side effects.   Episodes of hypoglycemia? No    Most Recent Eye Exam: need to appoint eye exam, suggested some of the clinics. Daily foot checks: yes  --------------------------------------------------------------------------------------------------- Hypertension, follow-up  BP Readings from Last 3 Encounters:  10/11/22 126/88  04/06/22 124/76  09/19/21 130/84   Wt Readings from Last 3 Encounters:  10/11/22 218 lb (98.9 kg)  04/06/22 221 lb (100.2 kg)  09/19/21 220 lb (99.8 kg)     He was last seen for hypertension 6 months ago.  BP at that visit was 126/86 Management since that visit includes . He reports good compliance with treatment. He is not having side effects.  He is not exercising besides walking in a job He is not adherent to low salt diet but trying to stick with healthy food choices. He does smoke.  Use of agents associated with hypertension: none.   --------------------------------------------------------------------------------------------------- Lipid/Cholesterol, follow-up  Last Lipid Panel: Lab Results  Component Value Date    CHOL 210 (H) 04/06/2022   LDLCALC 136 (H) 04/06/2022   HDL 45 04/06/2022   TRIG 164 (H) 04/06/2022    He was last seen for this 6 months ago.  Management since that visit includes Zetia 10mg  and  Vascepa 1 gm, statin declined in the past, Last LDL 136. He said he could not tolerate any statin and Zetia and quitted both  He reports good compliance with treatment. He is not having side effects.   Last metabolic panel Lab Results  Component Value Date   GLUCOSE 143 (H) 04/06/2022   NA 140 04/06/2022   K 3.7 04/06/2022   BUN 13 04/06/2022   CREATININE 1.15 04/06/2022   GFRNONAA >90 07/29/2013   GFRAA >90 07/29/2013   CALCIUM 9.5 04/06/2022   AST 32 04/06/2022   ALT 45 (H) 04/06/2022   The 10-year ASCVD risk score (Arnett DK, et al., 2019) is: 23.2%  ---------------------------------------------------------------------------------------------------    Current Outpatient Medications on File Prior to Visit  Medication Sig Dispense Refill   loratadine (CLARITIN) 10 MG tablet Take 1 tablet (10 mg total) by mouth daily. 90 tablet 2   omeprazole (PRILOSEC OTC) 20 MG tablet TAKE 1 TABLET(20 MG) BY MOUTH DAILY 90 tablet 1   Finerenone (KERENDIA) 10 MG TABS Take 1 tablet by mouth daily. (Patient not taking: Reported on 10/11/2022) 90 tablet 1   No current facility-administered medications on file prior to visit.   Past Medical History:  Diagnosis Date   Allergic rhinitis    GERD (gastroesophageal reflux disease)    Hemorrhoids    Hyperlipidemia 2022   not taking statin therapy  currently    Hypertension    does not see a cardiologist   Osteoarthritis    Past Surgical History:  Procedure Laterality Date   APPENDECTOMY     age 59   JOINT REPLACEMENT Right 2015   total knee arthroplasty, Dr Lorin Mercy   KNEE ARTHROPLASTY Right 07/28/2013   Procedure: COMPUTER ASSISTED TOTAL KNEE ARTHROPLASTY;  Surgeon: Marybelle Killings, MD;  Location: [facility];  Service: Orthopedics;  Laterality: Right;  Right  Total Knee Arthroplasty   KNEE SURGERY  1976, 1992   right     Family History  Problem Relation Age of Onset   Ovarian cancer Mother    Alzheimer's disease Father    Social History   Socioeconomic History   Marital status: Single    Spouse name: Not on file   Number of children: 3   Years of education: Not on file   Highest education level: Not on file  Occupational History   Not on file  Tobacco Use   Smoking status: Never   Smokeless tobacco: Never  Vaping Use   Vaping Use: Never used  Substance and Sexual Activity   Alcohol use: Yes    Comment: reports beer every now and then   Drug use: No   Sexual activity: Not on file  Other Topics Concern   Not on file  Social History Narrative   Not on file   Social Determinants of Health   Financial Resource Strain: Low Risk  (04/06/2022)   Overall Financial Resource Strain (CARDIA)    Difficulty of Paying Living Expenses: Not hard at all  Food Insecurity: No Food Insecurity (04/06/2022)   Hunger Vital Sign    Worried About Running Out of Food in the Last Year: Never true    [place] in the Last Year: Never true  Transportation Needs: No Transportation Needs (04/06/2022)   PRAPARE - Hydrologist (Medical): No    Lack of Transportation (Non-Medical): No  Physical Activity: Inactive (04/06/2022)   Exercise Vital Sign    Days of Exercise per Week: 0 days    Minutes of Exercise per Session: 0 min  Stress: No Stress Concern Present (04/06/2022)   [place]    Feeling of Stress : Not at all  Social Connections: Moderately Isolated (04/06/2022)   Social Connection and Isolation Panel [NHANES]    Frequency of Communication with Friends and Family: More than three times a week    Frequency of Social Gatherings with Friends and Family: More than three times a week    Attends Religious Services: Never    Marine scientist or  Organizations: No    Attends Archivist Meetings: Never    Marital Status: Married    Review of Systems  Constitutional:  Negative for chills and fever.  HENT:  Negative for congestion, rhinorrhea and sore throat.   Respiratory:  Negative for cough and shortness of breath.   Cardiovascular:  Negative for chest pain and palpitations.  Gastrointestinal:  Negative for abdominal pain, constipation, diarrhea, nausea and vomiting.  Genitourinary:  Negative for dysuria and urgency.  Musculoskeletal:  Positive for myalgias (leg pain). Negative for arthralgias and back pain.  Neurological:  Negative for dizziness and headaches.  Psychiatric/Behavioral:  Negative for dysphoric mood. The patient is not nervous/anxious.      Objective:  BP 126/88 (BP Location: Right Arm)   Pulse (!) 104  Temp (!) 97.4 F (36.3 C)   Resp 18   Ht 5\' 10"  (1.778 m)   Wt 218 lb (98.9 kg)   BMI 31.28 kg/m      10/11/2022    8:55 AM 10/11/2022    7:37 AM 10/11/2022    7:31 AM  BP/Weight  Systolic BP 126 120 124  Diastolic BP 88 100 100  Wt. (Lbs)   218  BMI   31.28 kg/m2    Physical Exam Vitals reviewed.  Constitutional:      Appearance: Normal appearance.  HENT:     Right Ear: Tympanic membrane normal.     Left Ear: Tympanic membrane normal.     Nose: Nose normal.     Mouth/Throat:     Pharynx: No oropharyngeal exudate or posterior oropharyngeal erythema.  Eyes:     Conjunctiva/sclera: Conjunctivae normal.  Neck:     Vascular: No carotid bruit.  Cardiovascular:     Rate and Rhythm: Normal rate and regular rhythm.     Pulses: Normal pulses.     Heart sounds: Normal heart sounds.  Pulmonary:     Effort: Pulmonary effort is normal.     Breath sounds: Normal breath sounds.  Abdominal:     General: Bowel sounds are normal.     Palpations: There is no mass.     Tenderness: There is no abdominal tenderness.  Musculoskeletal:     Cervical back: Normal range of motion.  Skin:     Findings: No lesion.  Neurological:     Mental Status: He is alert and oriented to person, place, and time.  Psychiatric:        Mood and Affect: Mood normal.        Behavior: Behavior normal.     Diabetic Foot Exam - Simple   No data filed        04/06/2022    8:19 AM 02/15/2021    7:54 AM 11/04/2020    2:25 PM  Depression screen PHQ 2/9  Decreased Interest 0 0 0  Down, Depressed, Hopeless 0 0 0  PHQ - 2 Score 0 0 0  Altered sleeping 0    Tired, decreased energy 0    Change in appetite 0    Feeling bad or failure about yourself  0    Trouble concentrating 0    Moving slowly or fidgety/restless 0    Suicidal thoughts 0    PHQ-9 Score 0    Difficult doing work/chores Not difficult at all         11/04/2020    2:25 PM 09/19/2021    8:08 AM 04/06/2022    8:19 AM  Fall Risk  Falls in the past year? 0 0 0  Was there an injury with Fall? 0 0 0  Fall Risk Category Calculator 0 0 0  Fall Risk Category (Retired) Low Low Low  (RETIRED) Patient Fall Risk Level Low fall risk Low fall risk Low fall risk  Patient at Risk for Falls Due to  No Fall Risks No Fall Risks  Fall risk Follow up Falls evaluation completed Falls evaluation completed Falls evaluation completed    Lab Results  Component Value Date   WBC 7.7 04/06/2022   HGB 15.9 04/06/2022   HCT 45.3 04/06/2022   PLT 221 04/06/2022   GLUCOSE 143 (H) 04/06/2022   CHOL 210 (H) 04/06/2022   TRIG 164 (H) 04/06/2022   HDL 45 04/06/2022   LDLCALC 136 (H) 04/06/2022   ALT  45 (H) 04/06/2022   AST 32 04/06/2022   NA 140 04/06/2022   K 3.7 04/06/2022   CL 97 04/06/2022   CREATININE 1.15 04/06/2022   BUN 13 04/06/2022   CO2 27 04/06/2022   TSH 0.498 11/05/2020   INR 1.11 07/18/2013   HGBA1C 6.4 (H) 04/06/2022      Assessment & Plan:   Type 2 diabetes mellitus with hyperglycemia, without long-term current use of insulin (HCC) Assessment & Plan: Continue metformin 500 mg BD Patient refused to check blood sugar at  home Last A1C 6.4, will be reassessed today  Nutrition: Stressed importance of moderation in sodium intake, saturated fat and cholesterol, caloric balance, sufficient intake of complex carbohydrates, fiber, calcium and iron.   Exercise: Stressed the importance of regular exercise.     Orders: -     Hemoglobin A1c -     Microalbumin / creatinine urine ratio -     Blood Glucose Monitoring Suppl; 1 each by Does not apply route daily. May substitute to any manufacturer covered by patient's insurance.  Dispense: 1 each; Refill: 0 -     Blood Glucose Test; 1 each by In Vitro route in the morning, at noon, and at bedtime. May substitute to any manufacturer covered by patient's insurance.  Dispense: 100 strip; Refill: 0 -     Lancet Device; 1 each by Does not apply route in the morning, at noon, and at bedtime. May substitute to any manufacturer covered by patient's insurance.  Dispense: 1 each; Refill: 0 -     Lancets Misc.; 1 each by Does not apply route in the morning, at noon, and at bedtime. May substitute to any manufacturer covered by patient's insurance.  Dispense: 100 each; Refill: 0  Mixed hyperlipidemia -     Lipid panel  Essential hypertension Assessment & Plan: Continue lotrel OD  Labs will be drawn today  Nutrition: Stressed importance of moderation in sodium intake, saturated fat and cholesterol, caloric balance, sufficient intake of complex carbohydrates, fiber, calcium and iron.   Exercise: Stressed the importance of regular exercise.     Orders: -     Comprehensive metabolic panel -     CBC with Differential/Platelet  Gastroesophageal reflux disease, unspecified whether esophagitis present Assessment & Plan: Continue Omeprazole 20 mg OD   Benign prostatic hyperplasia with urinary frequency -     PSA  Erectile dysfunction, unspecified erectile dysfunction type Assessment & Plan: Continue Cialis    Statin myopathy Assessment & Plan: Patient tried statin and Zetia  in past and failed  Do not want to try Repatha Added Nexletol 180 mg OD   Nutrition: Stressed importance of moderation in sodium intake, saturated fat and cholesterol, caloric balance, sufficient intake of complex carbohydrates, fiber, calcium and iron.   Exercise: Stressed the importance of regular exercise.       Follow-up: Return in about 4 months (around 02/09/2023) for FASTING, CHRONIC.  I, Neil Crouch have reviewed all documentation for this visit. The documentation on 10/11/22   for the exam, diagnosis, procedures, and orders are all accurate and complete.     An After Visit Summary was printed and given to the patient.  Neil Crouch, DNP, [place] [phone]

## 2022-10-11 ENCOUNTER — Ambulatory Visit: Payer: BC Managed Care – PPO | Admitting: Nurse Practitioner

## 2022-10-11 ENCOUNTER — Other Ambulatory Visit: Payer: Self-pay

## 2022-10-11 ENCOUNTER — Encounter: Payer: Self-pay | Admitting: Nurse Practitioner

## 2022-10-11 VITALS — BP 126/88 | HR 104 | Temp 97.4°F | Resp 18 | Ht 70.0 in | Wt 218.0 lb

## 2022-10-11 DIAGNOSIS — E1165 Type 2 diabetes mellitus with hyperglycemia: Secondary | ICD-10-CM

## 2022-10-11 DIAGNOSIS — I10 Essential (primary) hypertension: Secondary | ICD-10-CM | POA: Diagnosis not present

## 2022-10-11 DIAGNOSIS — K219 Gastro-esophageal reflux disease without esophagitis: Secondary | ICD-10-CM | POA: Diagnosis not present

## 2022-10-11 DIAGNOSIS — N529 Male erectile dysfunction, unspecified: Secondary | ICD-10-CM

## 2022-10-11 DIAGNOSIS — T466X5A Adverse effect of antihyperlipidemic and antiarteriosclerotic drugs, initial encounter: Secondary | ICD-10-CM

## 2022-10-11 DIAGNOSIS — R350 Frequency of micturition: Secondary | ICD-10-CM

## 2022-10-11 DIAGNOSIS — N401 Enlarged prostate with lower urinary tract symptoms: Secondary | ICD-10-CM

## 2022-10-11 DIAGNOSIS — E782 Mixed hyperlipidemia: Secondary | ICD-10-CM | POA: Diagnosis not present

## 2022-10-11 DIAGNOSIS — G720 Drug-induced myopathy: Secondary | ICD-10-CM

## 2022-10-11 MED ORDER — TADALAFIL 10 MG PO TABS: 10.0000 mg | ORAL_TABLET | Freq: Every day | ORAL | 0 refills | Status: DC

## 2022-10-11 MED ORDER — ICOSAPENT ETHYL 1 G PO CAPS: 2.0000 g | ORAL_CAPSULE | Freq: Two times a day (BID) | ORAL | 2 refills | Status: DC

## 2022-10-11 MED ORDER — LANCETS MISC. MISC: 1.0000 | Freq: Three times a day (TID) | 0 refills | Status: AC

## 2022-10-11 MED ORDER — NEXLETOL 180 MG PO TABS: 180.0000 mg | ORAL_TABLET | Freq: Every day | ORAL | 2 refills | Status: DC

## 2022-10-11 MED ORDER — METOPROLOL SUCCINATE ER 25 MG PO TB24: 25.0000 mg | ORAL_TABLET | Freq: Every day | ORAL | 0 refills | Status: DC

## 2022-10-11 MED ORDER — METFORMIN HCL 500 MG PO TABS: 500.0000 mg | ORAL_TABLET | Freq: Two times a day (BID) | ORAL | 1 refills | Status: DC

## 2022-10-11 MED ORDER — AMLODIPINE BESY-BENAZEPRIL HCL 10-40 MG PO CAPS: 1.0000 | ORAL_CAPSULE | Freq: Every day | ORAL | 1 refills | Status: DC

## 2022-10-11 MED ORDER — BLOOD GLUCOSE MONITORING SUPPL DEVI: 1.0000 | Freq: Every day | 0 refills | Status: DC

## 2022-10-11 MED ORDER — BLOOD GLUCOSE TEST VI STRP: 1.0000 | ORAL_STRIP | Freq: Three times a day (TID) | 0 refills | Status: AC

## 2022-10-11 MED ORDER — LANCET DEVICE MISC: 1.0000 | Freq: Three times a day (TID) | 0 refills | Status: AC

## 2022-10-11 NOTE — Patient Instructions (Signed)

## 2022-10-11 NOTE — Assessment & Plan Note (Signed)
Continue metformin 500 mg BD Patient refused to check blood sugar at home Last A1C 6.4, will be reassessed today  Nutrition: Stressed importance of moderation in sodium intake, saturated fat and cholesterol, caloric balance, sufficient intake of complex carbohydrates, fiber, calcium and iron.   Exercise: Stressed the importance of regular exercise.

## 2022-10-11 NOTE — Assessment & Plan Note (Signed)
Continue Omeprazole 20 mg OD

## 2022-10-11 NOTE — Assessment & Plan Note (Signed)
Continue lotrel OD  Labs will be drawn today  Nutrition: Stressed importance of moderation in sodium intake, saturated fat and cholesterol, caloric balance, sufficient intake of complex carbohydrates, fiber, calcium and iron.   Exercise: Stressed the importance of regular exercise.

## 2022-10-11 NOTE — Assessment & Plan Note (Signed)
Patient tried statin and Zetia in past and failed  Do not want to try Repatha Added Nexletol 180 mg OD   Nutrition: Stressed importance of moderation in sodium intake, saturated fat and cholesterol, caloric balance, sufficient intake of complex carbohydrates, fiber, calcium and iron.   Exercise: Stressed the importance of regular exercise.

## 2022-10-11 NOTE — Assessment & Plan Note (Signed)
Continue Cialis.

## 2022-10-13 ENCOUNTER — Other Ambulatory Visit: Payer: Self-pay | Admitting: Nurse Practitioner

## 2022-10-13 DIAGNOSIS — E1122 Type 2 diabetes mellitus with diabetic chronic kidney disease: Secondary | ICD-10-CM

## 2022-10-13 LAB — CBC WITH DIFFERENTIAL/PLATELET
Basophils Absolute: 0.1 10*3/uL (ref 0.0–0.2)
Basos: 1 %
EOS (ABSOLUTE): 0.5 10*3/uL — ABNORMAL HIGH (ref 0.0–0.4)
Eos: 6 %
Hematocrit: 47.9 % (ref 37.5–51.0)
Hemoglobin: 17.3 g/dL (ref 13.0–17.7)
Immature Grans (Abs): 0.1 10*3/uL (ref 0.0–0.1)
Immature Granulocytes: 1 %
Lymphocytes Absolute: 2.8 10*3/uL (ref 0.7–3.1)
Lymphs: 32 %
MCH: 33.5 pg — ABNORMAL HIGH (ref 26.6–33.0)
MCHC: 36.1 g/dL — ABNORMAL HIGH (ref 31.5–35.7)
MCV: 93 fL (ref 79–97)
Monocytes Absolute: 0.8 10*3/uL (ref 0.1–0.9)
Monocytes: 9 %
Neutrophils Absolute: 4.7 10*3/uL (ref 1.4–7.0)
Neutrophils: 51 %
Platelets: 218 10*3/uL (ref 150–450)
RBC: 5.17 x10E6/uL (ref 4.14–5.80)
RDW: 12.6 % (ref 11.6–15.4)
WBC: 8.9 10*3/uL (ref 3.4–10.8)

## 2022-10-13 LAB — COMPREHENSIVE METABOLIC PANEL
ALT: 35 IU/L (ref 0–44)
AST: 30 IU/L (ref 0–40)
Albumin/Globulin Ratio: 1.7 (ref 1.2–2.2)
Albumin: 4.5 g/dL (ref 3.9–4.9)
Alkaline Phosphatase: 75 IU/L (ref 44–121)
BUN/Creatinine Ratio: 10 (ref 10–24)
BUN: 12 mg/dL (ref 8–27)
Bilirubin Total: 0.3 mg/dL (ref 0.0–1.2)
CO2: 29 mmol/L (ref 20–29)
Calcium: 9.5 mg/dL (ref 8.6–10.2)
Chloride: 95 mmol/L — ABNORMAL LOW (ref 96–106)
Creatinine, Ser: 1.17 mg/dL (ref 0.76–1.27)
Globulin, Total: 2.6 g/dL (ref 1.5–4.5)
Glucose: 135 mg/dL — ABNORMAL HIGH (ref 70–99)
Potassium: 4 mmol/L (ref 3.5–5.2)
Sodium: 141 mmol/L (ref 134–144)
Total Protein: 7.1 g/dL (ref 6.0–8.5)
eGFR: 70 mL/min/{1.73_m2} (ref >59)

## 2022-10-13 LAB — CARDIOVASCULAR RISK ASSESSMENT

## 2022-10-13 LAB — LIPID PANEL
Chol/HDL Ratio: 4.3 ratio (ref 0.0–5.0)
Cholesterol, Total: 231 mg/dL — ABNORMAL HIGH (ref 100–199)
HDL: 54 mg/dL (ref >39)
LDL Chol Calc (NIH): 151 mg/dL — ABNORMAL HIGH (ref 0–99)
Triglycerides: 145 mg/dL (ref 0–149)
VLDL Cholesterol Cal: 26 mg/dL (ref 5–40)

## 2022-10-13 LAB — MICROALBUMIN / CREATININE URINE RATIO
Creatinine, Urine: 123 mg/dL
Microalb/Creat Ratio: 1124 mg/g creat — ABNORMAL HIGH (ref 0–29)
Microalbumin, Urine: 1382 ug/mL

## 2022-10-13 LAB — PSA: Prostate Specific Ag, Serum: 1.4 ng/mL (ref 0.0–4.0)

## 2022-10-13 LAB — HEMOGLOBIN A1C
Est. average glucose Bld gHb Est-mCnc: 140 mg/dL
Hgb A1c MFr Bld: 6.5 % — ABNORMAL HIGH (ref 4.8–5.6)

## 2022-10-13 MED ORDER — KERENDIA 10 MG PO TABS: 1.0000 | ORAL_TABLET | Freq: Every day | ORAL | 0 refills | Status: DC

## 2022-10-16 ENCOUNTER — Other Ambulatory Visit: Payer: Self-pay | Admitting: Nurse Practitioner

## 2022-10-16 DIAGNOSIS — E782 Mixed hyperlipidemia: Secondary | ICD-10-CM

## 2022-10-16 DIAGNOSIS — E1165 Type 2 diabetes mellitus with hyperglycemia: Secondary | ICD-10-CM

## 2022-10-16 DIAGNOSIS — E1122 Type 2 diabetes mellitus with diabetic chronic kidney disease: Secondary | ICD-10-CM

## 2022-10-16 MED ORDER — KERENDIA 10 MG PO TABS: 1.0000 | ORAL_TABLET | Freq: Every day | ORAL | 0 refills | Status: DC

## 2022-10-16 MED ORDER — NEXLETOL 180 MG PO TABS: 180.0000 mg | ORAL_TABLET | Freq: Every day | ORAL | 1 refills | Status: DC

## 2022-11-11 ENCOUNTER — Ambulatory Visit: Payer: BC Managed Care – PPO | Admitting: Nurse Practitioner

## 2022-12-12 ENCOUNTER — Encounter: Payer: Self-pay | Admitting: Family Medicine

## 2022-12-12 ENCOUNTER — Ambulatory Visit (INDEPENDENT_AMBULATORY_CARE_PROVIDER_SITE_OTHER): Payer: BC Managed Care – PPO | Admitting: Family Medicine

## 2022-12-12 VITALS — BP 134/74 | HR 103 | Temp 97.4°F | Ht 70.0 in | Wt 216.0 lb

## 2022-12-12 DIAGNOSIS — J189 Pneumonia, unspecified organism: Secondary | ICD-10-CM

## 2022-12-12 DIAGNOSIS — R051 Acute cough: Secondary | ICD-10-CM

## 2022-12-12 DIAGNOSIS — J208 Acute bronchitis due to other specified organisms: Secondary | ICD-10-CM

## 2022-12-12 LAB — POC COVID19 BINAXNOW: SARS Coronavirus 2 Ag: NEGATIVE

## 2022-12-12 MED ORDER — HYDROCODONE BIT-HOMATROP MBR 5-1.5 MG/5ML PO SOLN: 5.0000 mL | Freq: Four times a day (QID) | ORAL | 0 refills | Status: DC | PRN

## 2022-12-12 MED ORDER — CEFTRIAXONE SODIUM 1 G IJ SOLR
1.0000 g | Freq: Once | INTRAMUSCULAR | Status: AC
  Administered 2022-12-12: 1 g via INTRAMUSCULAR

## 2022-12-12 MED ORDER — AMOXICILLIN-POT CLAVULANATE 875-125 MG PO TABS
1.0000 | ORAL_TABLET | Freq: Two times a day (BID) | ORAL | 0 refills | Status: DC
Start: 2022-12-12 — End: 2023-02-08

## 2022-12-12 MED ORDER — PREDNISONE 10 MG PO TABS
ORAL_TABLET | ORAL | 0 refills | Status: DC
Start: 2022-12-12 — End: 2022-12-12

## 2022-12-12 MED ORDER — TRIAMCINOLONE ACETONIDE 40 MG/ML IJ SUSP
80.0000 mg | Freq: Once | INTRAMUSCULAR | Status: AC
Start: 2022-12-12 — End: 2022-12-12
  Administered 2022-12-12: 80 mg via INTRAMUSCULAR

## 2022-12-12 MED ORDER — AMOXICILLIN-POT CLAVULANATE 875-125 MG PO TABS
1.0000 | ORAL_TABLET | Freq: Two times a day (BID) | ORAL | 0 refills | Status: DC
Start: 2022-12-12 — End: 2022-12-12

## 2022-12-12 MED ORDER — PREDNISONE 10 MG PO TABS
ORAL_TABLET | ORAL | 0 refills | Status: DC
Start: 2022-12-12 — End: 2022-12-14

## 2022-12-12 NOTE — Progress Notes (Unsigned)
Acute Office Visit  Subjective:    Patient ID: Gregory Marks, male    DOB: [birth year], 62 y.o.   MRN: [id number]  Chief Complaint  Patient presents with   URI    HPI: Patient is in today for uri. Symptoms started Friday, "I just feel bad", "Coughed so much my stomach hurts", Dry cough, headache, congestion-runny nose, sneezing, no chills and no body aches. Sudafed has helped some.  Past Medical History:  Diagnosis Date   Allergic rhinitis    GERD (gastroesophageal reflux disease)    Hemorrhoids    Hyperlipidemia 2022   not taking statin therapy currently    Hypertension    does not see a cardiologist   Osteoarthritis     Past Surgical History:  Procedure Laterality Date   APPENDECTOMY     age 89   JOINT REPLACEMENT Right 2015   total knee arthroplasty, Dr Ophelia CharterYates   KNEE ARTHROPLASTY Right 07/28/2013   Procedure: COMPUTER ASSISTED TOTAL KNEE ARTHROPLASTY;  Surgeon: Eldred MangesMark C Yates, MD;  Location: [facility];  Service: Orthopedics;  Laterality: Right;  Right Total Knee Arthroplasty   KNEE SURGERY  1976, 1992   right     Family History  Problem Relation Age of Onset   Ovarian cancer Mother    Alzheimer's disease Father     Social History   Socioeconomic History   Marital status: Single    Spouse name: Not on file   Number of children: 3   Years of education: Not on file   Highest education level: Not on file  Occupational History   Not on file  Tobacco Use   Smoking status: Never   Smokeless tobacco: Never  Vaping Use   Vaping Use: Never used  Substance and Sexual Activity   Alcohol use: Yes    Comment: reports beer every now and then   Drug use: No   Sexual activity: Not on file  Other Topics Concern   Not on file  Social History Narrative   Not on file   Social Determinants of Health   Financial Resource Strain: Low Risk  (04/06/2022)   Overall Financial Resource Strain (CARDIA)    Difficulty of Paying Living Expenses: Not hard at all  Food Insecurity: No  Food Insecurity (04/06/2022)   Hunger Vital Sign    Worried About Running Out of Food in the Last Year: Never true    Ran Out of Food in the Last Year: Never true  Transportation Needs: No Transportation Needs (04/06/2022)   PRAPARE - Administrator, Civil ServiceTransportation    Lack of Transportation (Medical): No    Lack of Transportation (Non-Medical): No  Physical Activity: Inactive (04/06/2022)   Exercise Vital Sign    Days of Exercise per Week: 0 days    Minutes of Exercise per Session: 0 min  Stress: No Stress Concern Present (04/06/2022)   [employer] - Occupational Stress Questionnaire    Feeling of Stress : Not at all  Social Connections: Moderately Isolated (04/06/2022)   Social Connection and Isolation Panel [NHANES]    Frequency of Communication with Friends and Family: More than three times a week    Frequency of Social Gatherings with Friends and Family: More than three times a week    Attends Religious Services: Never    Database administratorActive Member of Clubs or Organizations: No    Attends BankerClub or Organization Meetings: Never    Marital Status: Married  Catering managerntimate Partner Violence: Not At Risk (04/06/2022)   Humiliation,  Afraid, Rape, and Kick questionnaire    Fear of Current or Ex-Partner: No    Emotionally Abused: No    Physically Abused: No    Sexually Abused: No    Outpatient Medications Prior to Visit  Medication Sig Dispense Refill   amLODipine-benazepril (LOTREL) 10-40 MG capsule Take 1 capsule by mouth daily. 90 capsule 1   Bempedoic Acid (NEXLETOL) 180 MG TABS Take 1 tablet (180 mg total) by mouth daily. 90 tablet 1   Blood Glucose Monitoring Suppl DEVI 1 each by Does not apply route daily. May substitute to any manufacturer covered by patient's insurance. 1 each 0   Finerenone (KERENDIA) 10 MG TABS Take 1 tablet (10 mg total) by mouth daily. 90 tablet 0   icosapent Ethyl (VASCEPA) 1 g capsule Take 2 capsules (2 g total) by mouth 2 (two) times daily. 360 capsule 2   loratadine  (CLARITIN) 10 MG tablet Take 1 tablet (10 mg total) by mouth daily. 90 tablet 2   metFORMIN (GLUCOPHAGE) 500 MG tablet Take 1 tablet (500 mg total) by mouth 2 (two) times daily with a meal. 180 tablet 1   metoprolol succinate (TOPROL-XL) 25 MG 24 hr tablet Take 1 tablet (25 mg total) by mouth daily. 90 tablet 0   omeprazole (PRILOSEC OTC) 20 MG tablet TAKE 1 TABLET(20 MG) BY MOUTH DAILY 90 tablet 1   tadalafil (CIALIS) 10 MG tablet Take 1 tablet (10 mg total) by mouth daily. 90 tablet 0   No facility-administered medications prior to visit.    No Known Allergies  Review of Systems  Constitutional:  Positive for fatigue. Negative for chills and fever.  HENT:  Positive for congestion and sneezing. Negative for ear pain, postnasal drip, rhinorrhea, sinus pressure, sinus pain and sore throat.   Respiratory:  Positive for cough. Negative for shortness of breath.   Cardiovascular:  Negative for chest pain.  Gastrointestinal:  Negative for diarrhea, nausea and vomiting.  Neurological:  Negative for dizziness and headaches.       Objective:        12/12/2022    3:08 PM 10/11/2022    8:55 AM 10/11/2022    7:37 AM  Vitals with BMI  Height 5\' 10"     Weight 216 lbs    BMI 30.99    Systolic  126 120  Diastolic  88 100  Pulse 103      No data found.   Physical Exam Constitutional:      Appearance: Normal appearance.  HENT:     Right Ear: Tympanic membrane, ear canal and external ear normal. There is impacted cerumen (partially occluded).     Left Ear: Tympanic membrane, ear canal and external ear normal. There is impacted cerumen (partially occluded).     Nose: Nose normal. No congestion or rhinorrhea.     Right Turbinates: Swollen.     Left Turbinates: Swollen.     Mouth/Throat:     Mouth: Mucous membranes are moist.     Pharynx: No oropharyngeal exudate or posterior oropharyngeal erythema.  Cardiovascular:     Rate and Rhythm: Normal rate and regular rhythm.     Heart sounds:  Normal heart sounds.  Pulmonary:     Effort: Pulmonary effort is normal. No respiratory distress.     Breath sounds: Normal breath sounds. No wheezing, rhonchi or rales.  Abdominal:     General: Bowel sounds are normal.     Palpations: Abdomen is soft.     Tenderness: There is  no abdominal tenderness.  Lymphadenopathy:     Cervical: No cervical adenopathy.  Neurological:     Mental Status: He is alert.  Psychiatric:        Mood and Affect: Mood normal.        Behavior: Behavior normal.     Health Maintenance Due  Topic Date Due   OPHTHALMOLOGY EXAM  Never done   Zoster Vaccines- Shingrix (1 of 2) Never done    There are no preventive care reminders to display for this patient.   Lab Results  Component Value Date   TSH 0.498 11/05/2020   Lab Results  Component Value Date   WBC 8.9 10/11/2022   HGB 17.3 10/11/2022   HCT 47.9 10/11/2022   MCV 93 10/11/2022   PLT 218 10/11/2022   Lab Results  Component Value Date   NA 141 10/11/2022   K 4.0 10/11/2022   CO2 29 10/11/2022   GLUCOSE 135 (H) 10/11/2022   BUN 12 10/11/2022   CREATININE 1.17 10/11/2022   BILITOT 0.3 10/11/2022   ALKPHOS 75 10/11/2022   AST 30 10/11/2022   ALT 35 10/11/2022   PROT 7.1 10/11/2022   ALBUMIN 4.5 10/11/2022   CALCIUM 9.5 10/11/2022   EGFR 70 10/11/2022   Lab Results  Component Value Date   CHOL 231 (H) 10/11/2022   Lab Results  Component Value Date   HDL 54 10/11/2022   Lab Results  Component Value Date   LDLCALC 151 (H) 10/11/2022   Lab Results  Component Value Date   TRIG 145 10/11/2022   Lab Results  Component Value Date   CHOLHDL 4.3 10/11/2022   Lab Results  Component Value Date   HGBA1C 6.5 (H) 10/11/2022       Assessment & Plan:  There are no diagnoses linked to this encounter.   No orders of the defined types were placed in this encounter.   No orders of the defined types were placed in this encounter.    Follow-up: No follow-ups on file.  An  After Visit Summary was printed and given to the patient.  Blane Ohara, MD [facility] [phone]

## 2022-12-13 DIAGNOSIS — J189 Pneumonia, unspecified organism: Secondary | ICD-10-CM | POA: Insufficient documentation

## 2022-12-13 DIAGNOSIS — J208 Acute bronchitis due to other specified organisms: Secondary | ICD-10-CM | POA: Insufficient documentation

## 2022-12-14 ENCOUNTER — Other Ambulatory Visit: Payer: Self-pay

## 2022-12-14 DIAGNOSIS — J189 Pneumonia, unspecified organism: Secondary | ICD-10-CM

## 2022-12-14 MED ORDER — PREDNISONE 10 MG PO TABS
ORAL_TABLET | ORAL | 0 refills | Status: AC
Start: 2022-12-14 — End: 2022-12-19

## 2022-12-14 NOTE — Telephone Encounter (Signed)
Patient called stating that [facility] did not received RX for the prednisone. Re-sent Rx in to [facility] pharmacy and told patient to call them in an hour or two to check and see if they have received the rx.

## 2023-01-17 ENCOUNTER — Telehealth: Payer: Self-pay

## 2023-01-17 NOTE — Telephone Encounter (Signed)
Patient called stating that he had gotten better after taking the round of antibiotics and round of prednisone. He states that now with in the past week he has gotten right back to where he was. He is requesting refill on the Prednisone to be sent to [facility] in [place] on [address].

## 2023-01-18 NOTE — Telephone Encounter (Signed)
Left message to return call back. 

## 2023-02-06 ENCOUNTER — Ambulatory Visit: Payer: BC Managed Care – PPO | Admitting: Physician Assistant

## 2023-02-08 ENCOUNTER — Encounter: Payer: Self-pay | Admitting: Physician Assistant

## 2023-02-08 ENCOUNTER — Ambulatory Visit: Payer: BC Managed Care – PPO | Admitting: Physician Assistant

## 2023-02-08 VITALS — BP 136/90 | HR 88 | Temp 97.5°F | Resp 18 | Ht 70.0 in | Wt 215.0 lb

## 2023-02-08 DIAGNOSIS — D229 Melanocytic nevi, unspecified: Secondary | ICD-10-CM | POA: Diagnosis not present

## 2023-02-08 DIAGNOSIS — S161XXA Strain of muscle, fascia and tendon at neck level, initial encounter: Secondary | ICD-10-CM

## 2023-02-08 DIAGNOSIS — E782 Mixed hyperlipidemia: Secondary | ICD-10-CM | POA: Diagnosis not present

## 2023-02-08 DIAGNOSIS — K219 Gastro-esophageal reflux disease without esophagitis: Secondary | ICD-10-CM | POA: Diagnosis not present

## 2023-02-08 DIAGNOSIS — E1122 Type 2 diabetes mellitus with diabetic chronic kidney disease: Secondary | ICD-10-CM

## 2023-02-08 DIAGNOSIS — N189 Chronic kidney disease, unspecified: Secondary | ICD-10-CM

## 2023-02-08 MED ORDER — OMEPRAZOLE 20 MG PO CPDR
20.0000 mg | DELAYED_RELEASE_CAPSULE | Freq: Every day | ORAL | 3 refills | Status: DC
Start: 2023-02-08 — End: 2023-02-08

## 2023-02-08 MED ORDER — OMEPRAZOLE 20 MG PO CPDR
20.0000 mg | DELAYED_RELEASE_CAPSULE | Freq: Every day | ORAL | 3 refills | Status: DC
Start: 2023-02-08 — End: 2023-09-11

## 2023-02-08 NOTE — Progress Notes (Signed)
Subjective:  Patient ID: Gregory Marks, male    DOB: 1961/01/25  Age: 62 y.o. MRN: [id number]  Chief Complaint  Patient presents with   Medical Management of Chronic Issues    HPI  Patient is here for the follow up for DM2, hyperlipidemia and hypertension. 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 His job is stressful. He smokes half pack everyday and drinks occasionally. He mentioned he does not have any desire to quit smoking. He is not watching his diet strictly but trying to eat healthy.  Patient had severe neck pain from back in October and went to [facility] and had an x-ray and they said it was arthritis on his neck that was causing the pain. Patient denies it as a bone pain. Describes the pain as a burning stinging sometimes. Patient mostly notices it at night when laying on his left side he can't stay there long because it causes him pain.   Patient admits to a growth on his back that he wants me to look at it. Patient states it doesn't bother him, but does want it to be looked at. Denies being able to monitor it, but that his significant other pointed it out and told him to have it looked at.   Current smoker who is not interested in quitting      02/08/2023    1:37 PM 12/12/2022    3:11 PM 04/06/2022    8:19 AM 02/15/2021    7:54 AM 11/04/2020    2:25 PM  Depression screen PHQ 2/9  Decreased Interest 0 0 0 0 0  Down, Depressed, Hopeless 0 0 0 0 0  PHQ - 2 Score 0 0 0 0 0  Altered sleeping 0  0    Tired, decreased energy 0  0    Change in appetite 0  0    Feeling bad or failure about yourself  0  0    Trouble concentrating 0  0    Moving slowly or fidgety/restless 0  0    Suicidal thoughts 0  0    PHQ-9 Score 0  0    Difficult doing work/chores Not difficult at all  Not difficult at all          02/08/2023    1:37 PM  Fall Risk   Falls in the past year? 0  Number falls in past yr: 0  Injury with Fall? 0  Risk for fall due to :  No Fall Risks  Follow up Falls evaluation completed;Falls prevention discussed    Patient Care Team: Langley Gauss, [place] as PCP - General (Physician Assistant)   Review of Systems  Constitutional:  Negative for chills and fever.  HENT:  Negative for congestion, rhinorrhea and sore throat.   Respiratory:  Negative for cough and shortness of breath.   Cardiovascular:  Negative for chest pain and palpitations.  Gastrointestinal:  Negative for abdominal pain, constipation, diarrhea, nausea and vomiting.  Genitourinary:  Negative for dysuria and urgency.  Musculoskeletal:  Negative for arthralgias, back pain and myalgias.  Neurological:  Negative for dizziness and headaches.  Psychiatric/Behavioral:  Negative for dysphoric mood. The patient is not nervous/anxious.     Current Outpatient Medications on File Prior to Visit  Medication Sig Dispense Refill   amLODipine-benazepril (LOTREL) 10-40 MG capsule Take 1 capsule by mouth daily. 90 capsule 1   icosapent Ethyl (VASCEPA) 1 g capsule Take 2 capsules (2 g  total) by mouth 2 (two) times daily. 360 capsule 2   loratadine (CLARITIN) 10 MG tablet Take 1 tablet (10 mg total) by mouth daily. 90 tablet 2   metFORMIN (GLUCOPHAGE) 500 MG tablet Take 1 tablet (500 mg total) by mouth 2 (two) times daily with a meal. 180 tablet 1   metoprolol succinate (TOPROL-XL) 25 MG 24 hr tablet Take 1 tablet (25 mg total) by mouth daily. 90 tablet 0   tadalafil (CIALIS) 10 MG tablet Take 1 tablet (10 mg total) by mouth daily. 90 tablet 0   No current facility-administered medications on file prior to visit.   Past Medical History:  Diagnosis Date   Allergic rhinitis    GERD (gastroesophageal reflux disease)    Hemorrhoids    Hyperlipidemia 2022   not taking statin therapy currently    Hypertension    does not see a cardiologist   Osteoarthritis    Past Surgical History:  Procedure Laterality Date   APPENDECTOMY     age 20   JOINT REPLACEMENT Right 2015    total knee arthroplasty, Dr Ophelia Charter   KNEE ARTHROPLASTY Right 07/28/2013   Procedure: COMPUTER ASSISTED TOTAL KNEE ARTHROPLASTY;  Surgeon: Eldred Manges, MD;  Location: [facility] OR;  Service: Orthopedics;  Laterality: Right;  Right Total Knee Arthroplasty   KNEE SURGERY  1976, 1992   right     Family History  Problem Relation Age of Onset   Ovarian cancer Mother    Alzheimer's disease Father    Social History   Socioeconomic History   Marital status: Single    Spouse name: Not on file   Number of children: 3   Years of education: Not on file   Highest education level: Not on file  Occupational History   Not on file  Tobacco Use   Smoking status: Former    Types: Cigarettes   Smokeless tobacco: Never  Vaping Use   Vaping Use: Never used  Substance and Sexual Activity   Alcohol use: Yes    Comment: reports beer every now and then   Drug use: No   Sexual activity: Not on file  Other Topics Concern   Not on file  Social History Narrative   Not on file   Social Determinants of Health   Financial Resource Strain: Low Risk  (04/06/2022)   Overall Financial Resource Strain (CARDIA)    Difficulty of Paying Living Expenses: Not hard at all  Food Insecurity: No Food Insecurity (04/06/2022)   Hunger Vital Sign    Worried About Running Out of Food in the Last Year: Never true    Ran Out of Food in the Last Year: Never true  Transportation Needs: No Transportation Needs (04/06/2022)   PRAPARE - Administrator, Civil Service (Medical): No    Lack of Transportation (Non-Medical): No  Physical Activity: Inactive (04/06/2022)   Exercise Vital Sign    Days of Exercise per Week: 0 days    Minutes of Exercise per Session: 0 min  Stress: No Stress Concern Present (04/06/2022)   [employer] of Occupational Health - Occupational Stress Questionnaire    Feeling of Stress : Not at all  Social Connections: Moderately Isolated (04/06/2022)   Social Connection and Isolation Panel [NHANES]     Frequency of Communication with Friends and Family: More than three times a week    Frequency of Social Gatherings with Friends and Family: More than three times a week    Attends Religious Services:  Never    Active Member of Clubs or Organizations: No    Attends Banker Meetings: Never    Marital Status: Married    Objective:  BP (!) 136/90   Pulse 88   Temp (!) 97.5 F (36.4 C)   Resp 18   Ht 5\' 10"  (1.778 m)   Wt 215 lb (97.5 kg)   BMI 30.85 kg/m      02/08/2023    1:33 PM 12/12/2022    3:08 PM 10/11/2022    8:55 AM  BP/Weight  Systolic BP 136 134 126  Diastolic BP 90 74 88  Wt. (Lbs) 215 216   BMI 30.85 kg/m2 30.99 kg/m2    Physical Exam Constitutional:      Appearance: He is obese.  Eyes:     Conjunctiva/sclera: Conjunctivae normal.  Cardiovascular:     Rate and Rhythm: Normal rate and regular rhythm.     Heart sounds: Normal heart sounds.  Pulmonary:     Effort: Pulmonary effort is normal.     Breath sounds: Wheezing present.  Musculoskeletal:     Cervical back: Tenderness present. No rigidity.  Skin:    Coloration: Skin is not mottled or pale.     Findings: Burn present. No abrasion.          Comments: Healing sunburn that is causing flaking.   Neurological:     Mental Status: He is alert.      Diabetic Foot Exam - Simple   Simple Foot Form  02/08/2023  1:53 PM  Visual Inspection See comments: Yes Sensation Testing Intact to touch and monofilament testing bilaterally: Yes Pulse Check Posterior Tibialis and Dorsalis pulse intact bilaterally: Yes Comments Wound on medial side of great toe. Injury sustained last week while fishing. Healing with no sign of continued breakdown.       Lab Results  Component Value Date   WBC 8.9 10/11/2022   HGB 17.3 10/11/2022   HCT 47.9 10/11/2022   PLT 218 10/11/2022   GLUCOSE 135 (H) 10/11/2022   CHOL 231 (H) 10/11/2022   TRIG 145 10/11/2022   HDL 54 10/11/2022   LDLCALC 151 (H) 10/11/2022   ALT  35 10/11/2022   AST 30 10/11/2022   NA 141 10/11/2022   K 4.0 10/11/2022   CL 95 (L) 10/11/2022   CREATININE 1.17 10/11/2022   BUN 12 10/11/2022   CO2 29 10/11/2022   TSH 0.498 11/05/2020   INR 1.11 07/18/2013   HGBA1C 6.5 (H) 10/11/2022      Assessment & Plan:    Type 2 diabetes mellitus with chronic kidney disease, without long-term current use of insulin, unspecified CKD stage [facility]) Assessment & Plan: Patient was unable to get the [place].  Labs drawn today Will assess to see if kidney function has changed. Will look into getting Chauncey Mann started again if labs are still abnormal.  Orders: -     CBC with Differential/Platelet -     Comprehensive metabolic panel -     Hemoglobin A1c -     Microalbumin / creatinine urine ratio  Mixed hyperlipidemia Assessment & Plan: Uncontrolled Labs drawn today We will add medication if needed Patient will continue to monitor diet and exercise 3X a week.   Orders: -     Lipid panel  Atypical mole Assessment & Plan: Patient had two abnormal moles. One that is raised in the center of his back and then another that is dark with abnormal boarders on his left shoulder.  Patient also has another abnormal looking mole on the left side of his chest close to the sternum. Referred to dermatology for further assessment and removal if needed.   Orders: -     Ambulatory referral to Dermatology  Gastroesophageal reflux disease, unspecified whether esophagitis present -     Omeprazole; Take 1 capsule (20 mg total) by mouth daily.  Dispense: 30 capsule; Refill: 3  Neck muscle strain, initial encounter Assessment & Plan: Performed some deep tissue massage and trigger point release. Patient stated it felt much better. Encouraged to stretch the affected muscle and take ibuprofen as needed.       Meds ordered this encounter  Medications   DISCONTD: omeprazole (PRILOSEC) 20 MG capsule    Sig: Take 1 capsule (20 mg total) by mouth daily.     Dispense:  30 capsule    Refill:  3   omeprazole (PRILOSEC) 20 MG capsule    Sig: Take 1 capsule (20 mg total) by mouth daily.    Dispense:  30 capsule    Refill:  3     Orders Placed This Encounter  Procedures   CBC with Differential/Platelet   Comprehensive metabolic panel   Lipid panel   Hemoglobin A1c   Microalbumin / creatinine urine ratio   Ambulatory referral to Dermatology      Follow-up: Return in about 3 months (around 05/11/2023) for Chronic, fasting, Huston Foley.   I,Carolyn M Morrison,acting as a Neurosurgeon for [employer], PA.,have documented all relevant documentation on the behalf of Langley Gauss, PA,as directed by  Langley Gauss, PA while in the presence of Langley Gauss, [place].   An After Visit Summary was printed and given to the patient.  Langley Gauss, [place] [facility] [phone]

## 2023-02-08 NOTE — Progress Notes (Deleted)
Subjective:  Patient ID: Gregory Marks, male    DOB: 1960/10/25  Age: 62 y.o. MRN: [id number]  Chief Complaint  Patient presents with   Medical Management of Chronic Issues    HPI     Patient is here for the follow up for DM2, hyperlipidemia and hypertension. 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 His job is stressful. He smokes half pack everyday and drinks occasionally. He mentioned he does not have any desire to quit smoking. He is not watching his diet strictly but trying to eat healthy.     12/12/2022    3:11 PM 04/06/2022    8:19 AM 02/15/2021    7:54 AM 11/04/2020    2:25 PM  Depression screen PHQ 2/9  Decreased Interest 0 0 0 0  Down, Depressed, Hopeless 0 0 0 0  PHQ - 2 Score 0 0 0 0  Altered sleeping  0    Tired, decreased energy  0    Change in appetite  0    Feeling bad or failure about yourself   0    Trouble concentrating  0    Moving slowly or fidgety/restless  0    Suicidal thoughts  0    PHQ-9 Score  0    Difficult doing work/chores  Not difficult at all          12/12/2022    3:11 PM  Fall Risk   Falls in the past year? 0  Number falls in past yr: 0  Injury with Fall? 0  Risk for fall due to : No Fall Risks  Follow up Falls evaluation completed    Patient Care Team: Lurline Del, FNP (Inactive) as PCP - General (Family Medicine)   Review of Systems  Constitutional:  Negative for chills and fever.  HENT:  Negative for congestion, rhinorrhea and sore throat.   Respiratory:  Negative for cough and shortness of breath.   Cardiovascular:  Negative for chest pain and palpitations.  Gastrointestinal:  Negative for abdominal pain, constipation, diarrhea, nausea and vomiting.  Genitourinary:  Negative for dysuria and urgency.  Musculoskeletal:  Negative for arthralgias, back pain and myalgias.  Neurological:  Negative for dizziness and headaches.  Psychiatric/Behavioral:  Negative for dysphoric mood. The patient is  not nervous/anxious.     Current Outpatient Medications on File Prior to Visit  Medication Sig Dispense Refill   amLODipine-benazepril (LOTREL) 10-40 MG capsule Take 1 capsule by mouth daily. 90 capsule 1   icosapent Ethyl (VASCEPA) 1 g capsule Take 2 capsules (2 g total) by mouth 2 (two) times daily. 360 capsule 2   loratadine (CLARITIN) 10 MG tablet Take 1 tablet (10 mg total) by mouth daily. 90 tablet 2   metFORMIN (GLUCOPHAGE) 500 MG tablet Take 1 tablet (500 mg total) by mouth 2 (two) times daily with a meal. 180 tablet 1   metoprolol succinate (TOPROL-XL) 25 MG 24 hr tablet Take 1 tablet (25 mg total) by mouth daily. 90 tablet 0   omeprazole (PRILOSEC OTC) 20 MG tablet TAKE 1 TABLET(20 MG) BY MOUTH DAILY 90 tablet 1   tadalafil (CIALIS) 10 MG tablet Take 1 tablet (10 mg total) by mouth daily. 90 tablet 0   No current facility-administered medications on file prior to visit.   Past Medical History:  Diagnosis Date   Allergic rhinitis    GERD (gastroesophageal reflux disease)    Hemorrhoids    Hyperlipidemia 2022  not taking statin therapy currently    Hypertension    does not see a cardiologist   Osteoarthritis    Past Surgical History:  Procedure Laterality Date   APPENDECTOMY     age 24   JOINT REPLACEMENT Right 2015   total knee arthroplasty, Dr Ophelia Charter   KNEE ARTHROPLASTY Right 07/28/2013   Procedure: COMPUTER ASSISTED TOTAL KNEE ARTHROPLASTY;  Surgeon: Eldred Manges, MD;  Location: [facility] OR;  Service: Orthopedics;  Laterality: Right;  Right Total Knee Arthroplasty   KNEE SURGERY  1976, 1992   right     Family History  Problem Relation Age of Onset   Ovarian cancer Mother    Alzheimer's disease Father    Social History   Socioeconomic History   Marital status: Single    Spouse name: Not on file   Number of children: 3   Years of education: Not on file   Highest education level: Not on file  Occupational History   Not on file  Tobacco Use   Smoking status:  Former    Types: Cigarettes   Smokeless tobacco: Never  Vaping Use   Vaping Use: Never used  Substance and Sexual Activity   Alcohol use: Yes    Comment: reports beer every now and then   Drug use: No   Sexual activity: Not on file  Other Topics Concern   Not on file  Social History Narrative   Not on file   Social Determinants of Health   Financial Resource Strain: Low Risk  (04/06/2022)   Overall Financial Resource Strain (CARDIA)    Difficulty of Paying Living Expenses: Not hard at all  Food Insecurity: No Food Insecurity (04/06/2022)   Hunger Vital Sign    Worried About Running Out of Food in the Last Year: Never true    Ran Out of Food in the Last Year: Never true  Transportation Needs: No Transportation Needs (04/06/2022)   PRAPARE - Administrator, Civil Service (Medical): No    Lack of Transportation (Non-Medical): No  Physical Activity: Inactive (04/06/2022)   Exercise Vital Sign    Days of Exercise per Week: 0 days    Minutes of Exercise per Session: 0 min  Stress: No Stress Concern Present (04/06/2022)   [employer] of Occupational Health - Occupational Stress Questionnaire    Feeling of Stress : Not at all  Social Connections: Moderately Isolated (04/06/2022)   Social Connection and Isolation Panel [NHANES]    Frequency of Communication with Friends and Family: More than three times a week    Frequency of Social Gatherings with Friends and Family: More than three times a week    Attends Religious Services: Never    Database administrator or Organizations: No    Attends Engineer, structural: Never    Marital Status: Married    Objective:  BP (!) 136/90   Pulse 88   Temp (!) 97.5 F (36.4 C)   Resp 18   Ht 5\' 10"  (1.778 m)   Wt 215 lb (97.5 kg)   BMI 30.85 kg/m      02/08/2023    1:33 PM 12/12/2022    3:08 PM 10/11/2022    8:55 AM  BP/Weight  Systolic BP 136 134 126  Diastolic BP 90 74 88  Wt. (Lbs) 215 216   BMI 30.85 kg/m2 30.99  kg/m2     Physical Exam  Diabetic Foot Exam - Simple   No data filed  Lab Results  Component Value Date   WBC 8.9 10/11/2022   HGB 17.3 10/11/2022   HCT 47.9 10/11/2022   PLT 218 10/11/2022   GLUCOSE 135 (H) 10/11/2022   CHOL 231 (H) 10/11/2022   TRIG 145 10/11/2022   HDL 54 10/11/2022   LDLCALC 151 (H) 10/11/2022   ALT 35 10/11/2022   AST 30 10/11/2022   NA 141 10/11/2022   K 4.0 10/11/2022   CL 95 (L) 10/11/2022   CREATININE 1.17 10/11/2022   BUN 12 10/11/2022   CO2 29 10/11/2022   TSH 0.498 11/05/2020   INR 1.11 07/18/2013   HGBA1C 6.5 (H) 10/11/2022      Assessment & Plan:    There are no diagnoses linked to this encounter.   No orders of the defined types were placed in this encounter.   No orders of the defined types were placed in this encounter.    Follow-up: No follow-ups on file.   I,Carolyn M Morrison,acting as a Neurosurgeon for [employer], PA.,have documented all relevant documentation on the behalf of Langley Gauss, PA,as directed by  Langley Gauss, PA while in the presence of Langley Gauss, [place].   An After Visit Summary was printed and given to the patient.  Langley Gauss, [place] [facility] [phone]

## 2023-02-08 NOTE — Assessment & Plan Note (Signed)
Uncontrolled Labs drawn today We will add medication if needed Patient will continue to monitor diet and exercise 3X a week.

## 2023-02-08 NOTE — Assessment & Plan Note (Signed)
Patient had two abnormal moles. One that is raised in the center of his back and then another that is dark with abnormal boarders on his left shoulder. Patient also has another abnormal looking mole on the left side of his chest close to the sternum. Referred to dermatology for further assessment and removal if needed.

## 2023-02-08 NOTE — Assessment & Plan Note (Signed)
Patient was unable to get the [place].  Labs drawn today Will assess to see if kidney function has changed. Will look into getting Chauncey Mann started again if labs are still abnormal.

## 2023-02-09 DIAGNOSIS — S161XXA Strain of muscle, fascia and tendon at neck level, initial encounter: Secondary | ICD-10-CM | POA: Insufficient documentation

## 2023-02-09 NOTE — Assessment & Plan Note (Signed)
Performed some deep tissue massage and trigger point release. Patient stated it felt much better. Encouraged to stretch the affected muscle and take ibuprofen as needed.

## 2023-02-11 LAB — MICROALBUMIN / CREATININE URINE RATIO
Creatinine, Urine: 104.4 mg/dL
Microalb/Creat Ratio: 427 mg/g creat — ABNORMAL HIGH (ref 0–29)
Microalbumin, Urine: 446.3 ug/mL

## 2023-02-11 LAB — CBC WITH DIFFERENTIAL/PLATELET
Basophils Absolute: 0.1 10*3/uL (ref 0.0–0.2)
Basos: 1 %
EOS (ABSOLUTE): 0.2 10*3/uL (ref 0.0–0.4)
Eos: 3 %
Hematocrit: 41 % (ref 37.5–51.0)
Hemoglobin: 14.6 g/dL (ref 13.0–17.7)
Immature Grans (Abs): 0.1 10*3/uL (ref 0.0–0.1)
Immature Granulocytes: 1 %
Lymphocytes Absolute: 2.2 10*3/uL (ref 0.7–3.1)
Lymphs: 26 %
MCH: 33.7 pg — ABNORMAL HIGH (ref 26.6–33.0)
MCHC: 35.6 g/dL (ref 31.5–35.7)
MCV: 95 fL (ref 79–97)
Monocytes Absolute: 1 10*3/uL — ABNORMAL HIGH (ref 0.1–0.9)
Monocytes: 11 %
Neutrophils Absolute: 5 10*3/uL (ref 1.4–7.0)
Neutrophils: 58 %
Platelets: 220 10*3/uL (ref 150–450)
RBC: 4.33 x10E6/uL (ref 4.14–5.80)
RDW: 12.5 % (ref 11.6–15.4)
WBC: 8.6 10*3/uL (ref 3.4–10.8)

## 2023-02-11 LAB — COMPREHENSIVE METABOLIC PANEL
ALT: 83 IU/L — ABNORMAL HIGH (ref 0–44)
AST: 38 IU/L (ref 0–40)
Albumin/Globulin Ratio: 1.8 (ref 1.2–2.2)
Albumin: 4.5 g/dL (ref 3.9–4.9)
Alkaline Phosphatase: 166 IU/L — ABNORMAL HIGH (ref 44–121)
BUN/Creatinine Ratio: 15 (ref 10–24)
BUN: 18 mg/dL (ref 8–27)
Bilirubin Total: 0.5 mg/dL (ref 0.0–1.2)
CO2: 27 mmol/L (ref 20–29)
Calcium: 9.5 mg/dL (ref 8.6–10.2)
Chloride: 96 mmol/L (ref 96–106)
Creatinine, Ser: 1.23 mg/dL (ref 0.76–1.27)
Globulin, Total: 2.5 g/dL (ref 1.5–4.5)
Glucose: 103 mg/dL — ABNORMAL HIGH (ref 70–99)
Potassium: 4.5 mmol/L (ref 3.5–5.2)
Sodium: 137 mmol/L (ref 134–144)
Total Protein: 7 g/dL (ref 6.0–8.5)
eGFR: 66 mL/min/{1.73_m2} (ref >59)

## 2023-02-11 LAB — LIPID PANEL
Chol/HDL Ratio: 3 ratio (ref 0.0–5.0)
Cholesterol, Total: 251 mg/dL — ABNORMAL HIGH (ref 100–199)
HDL: 83 mg/dL (ref >39)
LDL Chol Calc (NIH): 148 mg/dL — ABNORMAL HIGH (ref 0–99)
Triglycerides: 118 mg/dL (ref 0–149)
VLDL Cholesterol Cal: 20 mg/dL (ref 5–40)

## 2023-02-11 LAB — HEMOGLOBIN A1C
Est. average glucose Bld gHb Est-mCnc: 143 mg/dL
Hgb A1c MFr Bld: 6.6 % — ABNORMAL HIGH (ref 4.8–5.6)

## 2023-02-14 ENCOUNTER — Telehealth: Payer: Self-pay

## 2023-02-14 NOTE — Telephone Encounter (Signed)
Prior Authorization for Omeprazole was denied per insurance and response was as follows:  Denied. This health benefit plan does not cover the following services, supplies, drugs or charges: Any drug that is therapeutically equivalent to an over-the-counter drug where the over-the-counter products contain the same active ingredients as the prescription product at the same, or similar, strengths. -OR- Drugs that are Purchased over-the-counter, unless specifically listed as a covered drug in the formulary and a written prescription is provided.

## 2023-03-26 ENCOUNTER — Other Ambulatory Visit: Payer: Self-pay

## 2023-03-26 DIAGNOSIS — I10 Essential (primary) hypertension: Secondary | ICD-10-CM

## 2023-03-26 MED ORDER — METOPROLOL SUCCINATE ER 25 MG PO TB24: 25.0000 mg | ORAL_TABLET | Freq: Every day | ORAL | 0 refills | Status: DC

## 2023-03-27 ENCOUNTER — Other Ambulatory Visit: Payer: Self-pay

## 2023-04-09 ENCOUNTER — Other Ambulatory Visit: Payer: Self-pay

## 2023-04-09 ENCOUNTER — Telehealth: Payer: Self-pay

## 2023-04-09 DIAGNOSIS — N529 Male erectile dysfunction, unspecified: Secondary | ICD-10-CM

## 2023-04-09 DIAGNOSIS — N401 Enlarged prostate with lower urinary tract symptoms: Secondary | ICD-10-CM

## 2023-04-09 MED ORDER — TADALAFIL 10 MG PO TABS
10.0000 mg | ORAL_TABLET | Freq: Every day | ORAL | 0 refills | Status: DC
Start: 2023-04-09 — End: 2023-06-22

## 2023-04-09 MED ORDER — PAXLOVID (300/100) 20 X 150 MG & 10 X 100MG PO TBPK: 3.0000 | ORAL_TABLET | Freq: Two times a day (BID) | ORAL | 0 refills | Status: DC

## 2023-04-09 NOTE — Progress Notes (Signed)
Patient called reporting a positive home COVID test Saturday.  Paxlovid sent to pharmacy.  Patient instructed to call office for video visit if symptoms get worse.  CMP     Component Value Date/Time   NA 137 02/08/2023 1414   K 4.5 02/08/2023 1414   CL 96 02/08/2023 1414   CO2 27 02/08/2023 1414   GLUCOSE 103 (H) 02/08/2023 1414   GLUCOSE 138 (H) 07/29/2013 0500   BUN 18 02/08/2023 1414   CREATININE 1.23 02/08/2023 1414   CALCIUM 9.5 02/08/2023 1414   PROT 7.0 02/08/2023 1414   ALBUMIN 4.5 02/08/2023 1414   AST 38 02/08/2023 1414   ALT 83 (H) 02/08/2023 1414   ALKPHOS 166 (H) 02/08/2023 1414   BILITOT 0.5 02/08/2023 1414   EGFR 66 02/08/2023 1414   GFRNONAA >90 07/29/2013 0500

## 2023-04-09 NOTE — Telephone Encounter (Signed)
The patient left a message on the triage phone requesting something to be sent in being as he tested positive for COVID. I left a message for the patient to call the office back.  Patient will need a virtual appointment.

## 2023-04-10 ENCOUNTER — Telehealth: Payer: Self-pay

## 2023-04-10 ENCOUNTER — Other Ambulatory Visit: Payer: Self-pay

## 2023-04-10 MED ORDER — PAXLOVID (300/100) 20 X 150 MG & 10 X 100MG PO TBPK: 3.0000 | ORAL_TABLET | Freq: Two times a day (BID) | ORAL | 0 refills | Status: AC

## 2023-04-10 MED ORDER — PAXLOVID (300/100) 20 X 150 MG & 10 X 100MG PO TBPK: 3.0000 | ORAL_TABLET | Freq: Two times a day (BID) | ORAL | 0 refills | Status: DC

## 2023-04-10 NOTE — Telephone Encounter (Signed)
The patient called back this morning stating that [facility] did not get the covid prescription. The patient is requesting that the rx be sent to [facility] [address] [place] - [phone] [phone]

## 2023-04-12 ENCOUNTER — Other Ambulatory Visit: Payer: Self-pay

## 2023-05-14 ENCOUNTER — Encounter: Payer: Self-pay | Admitting: Physician Assistant

## 2023-05-14 ENCOUNTER — Ambulatory Visit: Payer: BC Managed Care – PPO | Admitting: Physician Assistant

## 2023-05-14 VITALS — BP 110/80 | HR 86 | Temp 97.7°F | Ht 70.0 in | Wt 221.4 lb

## 2023-05-14 DIAGNOSIS — E1165 Type 2 diabetes mellitus with hyperglycemia: Secondary | ICD-10-CM

## 2023-05-14 DIAGNOSIS — N3941 Urge incontinence: Secondary | ICD-10-CM

## 2023-05-14 DIAGNOSIS — S161XXA Strain of muscle, fascia and tendon at neck level, initial encounter: Secondary | ICD-10-CM

## 2023-05-14 DIAGNOSIS — I10 Essential (primary) hypertension: Secondary | ICD-10-CM | POA: Diagnosis not present

## 2023-05-14 MED ORDER — OXYBUTYNIN CHLORIDE ER 5 MG PO TB24
5.0000 mg | ORAL_TABLET | Freq: Every day | ORAL | 0 refills | Status: DC
Start: 2023-05-14 — End: 2023-06-21

## 2023-05-14 MED ORDER — AMLODIPINE BESY-BENAZEPRIL HCL 10-40 MG PO CAPS
1.0000 | ORAL_CAPSULE | Freq: Every day | ORAL | 1 refills | Status: DC
Start: 2023-05-14 — End: 2023-09-11

## 2023-05-14 MED ORDER — METOPROLOL SUCCINATE ER 25 MG PO TB24
25.0000 mg | ORAL_TABLET | Freq: Every day | ORAL | 0 refills | Status: DC
Start: 2023-05-14 — End: 2023-06-25

## 2023-05-14 MED ORDER — METFORMIN HCL 500 MG PO TABS
500.0000 mg | ORAL_TABLET | Freq: Two times a day (BID) | ORAL | 1 refills | Status: DC
Start: 2023-05-14 — End: 2023-09-11

## 2023-05-14 MED ORDER — TRIAMCINOLONE ACETONIDE 40 MG/ML IJ SUSP
80.0000 mg | Freq: Once | INTRAMUSCULAR | Status: AC
Start: 2023-05-14 — End: 2023-05-14
  Administered 2023-05-14: 80 mg via INTRAMUSCULAR

## 2023-05-14 NOTE — Progress Notes (Unsigned)
Subjective:  Patient ID: Gregory Marks, male    DOB: May 25, 1961  Age: 62 y.o. MRN: [id number]  No chief complaint on file.   HPI  Diabetes:  Complications: Glucose checking: Glucose logs: Hypoglycemia: Most recent A1C: Current medications:  Last Eye Exam: Foot checks:  Hyperlipidemia: Current medications: Will adjust his cholesterol medicine depending on labs  Hypertension: Complications: Current medications:  Diet: cut down on sugars and breads Exercise: Tries to increase it but works till late Discussed possibility of starting injectable for diabetes management Prescribed Oxybutnin 5mg  to help with urine frequency  Discussed seeing new orthopedic surgeon for shoulder numbness and neck pain. Unilateral in nature, told it was osteoarthritis. Did not want to to PT once a week. Only had an x-ray done.       02/08/2023    1:37 PM 12/12/2022    3:11 PM 04/06/2022    8:19 AM 02/15/2021    7:54 AM 11/04/2020    2:25 PM  Depression screen PHQ 2/9  Decreased Interest 0 0 0 0 0  Down, Depressed, Hopeless 0 0 0 0 0  PHQ - 2 Score 0 0 0 0 0  Altered sleeping 0  0    Tired, decreased energy 0  0    Change in appetite 0  0    Feeling bad or failure about yourself  0  0    Trouble concentrating 0  0    Moving slowly or fidgety/restless 0  0    Suicidal thoughts 0  0    PHQ-9 Score 0  0    Difficult doing work/chores Not difficult at all  Not difficult at all          02/08/2023    1:37 PM  Fall Risk   Falls in the past year? 0  Number falls in past yr: 0  Injury with Fall? 0  Risk for fall due to : No Fall Risks  Follow up Falls evaluation completed;Falls prevention discussed    Patient Care Team: Langley Gauss, [place] as PCP - General (Physician Assistant)   Review of Systems  Constitutional:  Negative for chills, fatigue and fever.  HENT:  Negative for congestion, ear pain and sore throat.   Respiratory:  Negative for cough and shortness of breath.   Cardiovascular:   Negative for chest pain and palpitations.  Gastrointestinal:  Negative for abdominal pain, constipation, diarrhea, nausea and vomiting.  Genitourinary:  Negative for difficulty urinating and dysuria.  Musculoskeletal:  Negative for arthralgias, back pain and myalgias.  Skin:  Negative for rash.  Neurological:  Negative for dizziness and headaches.  Psychiatric/Behavioral:  Negative for dysphoric mood.     Current Outpatient Medications on File Prior to Visit  Medication Sig Dispense Refill   amLODipine-benazepril (LOTREL) 10-40 MG capsule Take 1 capsule by mouth daily. 90 capsule 1   icosapent Ethyl (VASCEPA) 1 g capsule Take 2 capsules (2 g total) by mouth 2 (two) times daily. 360 capsule 2   loratadine (CLARITIN) 10 MG tablet Take 1 tablet (10 mg total) by mouth daily. 90 tablet 2   metFORMIN (GLUCOPHAGE) 500 MG tablet Take 1 tablet (500 mg total) by mouth 2 (two) times daily with a meal. 180 tablet 1   metoprolol succinate (TOPROL-XL) 25 MG 24 hr tablet Take 1 tablet (25 mg total) by mouth daily. 90 tablet 0   omeprazole (PRILOSEC) 20 MG capsule Take 1 capsule (20 mg total) by mouth daily. 30 capsule 3   tadalafil (CIALIS)  10 MG tablet Take 1 tablet (10 mg total) by mouth daily. 90 tablet 0   No current facility-administered medications on file prior to visit.   Past Medical History:  Diagnosis Date   Allergic rhinitis    GERD (gastroesophageal reflux disease)    Hemorrhoids    Hyperlipidemia 2022   not taking statin therapy currently    Hypertension    does not see a cardiologist   Osteoarthritis    Past Surgical History:  Procedure Laterality Date   APPENDECTOMY     age 34   JOINT REPLACEMENT Right 2015   total knee arthroplasty, Dr Ophelia Charter   KNEE ARTHROPLASTY Right 07/28/2013   Procedure: COMPUTER ASSISTED TOTAL KNEE ARTHROPLASTY;  Surgeon: Eldred Manges, MD;  Location: [facility] OR;  Service: Orthopedics;  Laterality: Right;  Right Total Knee Arthroplasty   KNEE SURGERY  1976,  1992   right     Family History  Problem Relation Age of Onset   Ovarian cancer Mother    Alzheimer's disease Father    Social History   Socioeconomic History   Marital status: Married    Spouse name: Not on file   Number of children: 3   Years of education: Not on file   Highest education level: Not on file  Occupational History   Not on file  Tobacco Use   Smoking status: Former    Types: Cigarettes   Smokeless tobacco: Never  Vaping Use   Vaping status: Never Used  Substance and Sexual Activity   Alcohol use: Yes    Comment: reports beer every now and then   Drug use: No   Sexual activity: Not on file  Other Topics Concern   Not on file  Social History Narrative   Not on file   Social Determinants of Health   Financial Resource Strain: Low Risk  (04/06/2022)   Overall Financial Resource Strain (CARDIA)    Difficulty of Paying Living Expenses: Not hard at all  Food Insecurity: No Food Insecurity (04/06/2022)   Hunger Vital Sign    Worried About Running Out of Food in the Last Year: Never true    Ran Out of Food in the Last Year: Never true  Transportation Needs: No Transportation Needs (04/06/2022)   PRAPARE - Administrator, Civil Service (Medical): No    Lack of Transportation (Non-Medical): No  Physical Activity: Inactive (04/06/2022)   Exercise Vital Sign    Days of Exercise per Week: 0 days    Minutes of Exercise per Session: 0 min  Stress: No Stress Concern Present (04/06/2022)   [employer] of Occupational Health - Occupational Stress Questionnaire    Feeling of Stress : Not at all  Social Connections: Moderately Isolated (04/06/2022)   Social Connection and Isolation Panel [NHANES]    Frequency of Communication with Friends and Family: More than three times a week    Frequency of Social Gatherings with Friends and Family: More than three times a week    Attends Religious Services: Never    Database administrator or Organizations: No     Attends Banker Meetings: Never    Marital Status: Married    Objective:  There were no vitals taken for this visit.     02/08/2023    1:33 PM 12/12/2022    3:08 PM 10/11/2022    8:55 AM  BP/Weight  Systolic BP 136 134 126  Diastolic BP 90 74 88  Wt. (Lbs) 215 216  BMI 30.85 kg/m2 30.99 kg/m2     Physical Exam  Diabetic Foot Exam - Simple   No data filed      Lab Results  Component Value Date   WBC 8.6 02/08/2023   HGB 14.6 02/08/2023   HCT 41.0 02/08/2023   PLT 220 02/08/2023   GLUCOSE 103 (H) 02/08/2023   CHOL 251 (H) 02/08/2023   TRIG 118 02/08/2023   HDL 83 02/08/2023   LDLCALC 148 (H) 02/08/2023   ALT 83 (H) 02/08/2023   AST 38 02/08/2023   NA 137 02/08/2023   K 4.5 02/08/2023   CL 96 02/08/2023   CREATININE 1.23 02/08/2023   BUN 18 02/08/2023   CO2 27 02/08/2023   TSH 0.498 11/05/2020   INR 1.11 07/18/2013   HGBA1C 6.6 (H) 02/08/2023      Assessment & Plan:    There are no diagnoses linked to this encounter.   No orders of the defined types were placed in this encounter.   No orders of the defined types were placed in this encounter.    Follow-up: No follow-ups on file.   I,Jacqua L Marsh,acting as a scribe for [employer], PA.,have documented all relevant documentation on the behalf of Langley Gauss, PA,as directed by  Langley Gauss, PA while in the presence of Langley Gauss, [place].   An After Visit Summary was printed and given to the patient.  Langley Gauss, [place] [facility] [phone]

## 2023-05-15 DIAGNOSIS — N3941 Urge incontinence: Secondary | ICD-10-CM | POA: Insufficient documentation

## 2023-05-15 LAB — HEMOGLOBIN A1C
Est. average glucose Bld gHb Est-mCnc: 131 mg/dL
Hgb A1c MFr Bld: 6.2 % — ABNORMAL HIGH (ref 4.8–5.6)

## 2023-05-15 LAB — CBC WITH DIFFERENTIAL/PLATELET
Basophils Absolute: 0.1 10*3/uL (ref 0.0–0.2)
Basos: 1 %
EOS (ABSOLUTE): 0.5 10*3/uL — ABNORMAL HIGH (ref 0.0–0.4)
Eos: 6 %
Hematocrit: 41.1 % (ref 37.5–51.0)
Hemoglobin: 15 g/dL (ref 13.0–17.7)
Immature Grans (Abs): 0.1 10*3/uL (ref 0.0–0.1)
Immature Granulocytes: 1 %
Lymphocytes Absolute: 2.9 10*3/uL (ref 0.7–3.1)
Lymphs: 32 %
MCH: 35.3 pg — ABNORMAL HIGH (ref 26.6–33.0)
MCHC: 36.5 g/dL — ABNORMAL HIGH (ref 31.5–35.7)
MCV: 97 fL (ref 79–97)
Monocytes Absolute: 0.9 10*3/uL (ref 0.1–0.9)
Monocytes: 10 %
Neutrophils Absolute: 4.6 10*3/uL (ref 1.4–7.0)
Neutrophils: 50 %
Platelets: 235 10*3/uL (ref 150–450)
RBC: 4.25 x10E6/uL (ref 4.14–5.80)
RDW: 12.3 % (ref 11.6–15.4)
WBC: 9.2 10*3/uL (ref 3.4–10.8)

## 2023-05-15 LAB — COMPREHENSIVE METABOLIC PANEL
ALT: 30 IU/L (ref 0–44)
AST: 20 IU/L (ref 0–40)
Albumin: 4.3 g/dL (ref 3.9–4.9)
Alkaline Phosphatase: 65 IU/L (ref 44–121)
BUN/Creatinine Ratio: 17 (ref 10–24)
BUN: 20 mg/dL (ref 8–27)
Bilirubin Total: <0.2 mg/dL (ref 0.0–1.2)
CO2: 24 mmol/L (ref 20–29)
Calcium: 9.7 mg/dL (ref 8.6–10.2)
Chloride: 97 mmol/L (ref 96–106)
Creatinine, Ser: 1.18 mg/dL (ref 0.76–1.27)
Globulin, Total: 2.5 g/dL (ref 1.5–4.5)
Glucose: 111 mg/dL — ABNORMAL HIGH (ref 70–99)
Potassium: 4.3 mmol/L (ref 3.5–5.2)
Sodium: 139 mmol/L (ref 134–144)
Total Protein: 6.8 g/dL (ref 6.0–8.5)
eGFR: 69 mL/min/{1.73_m2} (ref >59)

## 2023-05-15 LAB — LIPID PANEL
Chol/HDL Ratio: 5.5 ratio — ABNORMAL HIGH (ref 0.0–5.0)
Cholesterol, Total: 220 mg/dL — ABNORMAL HIGH (ref 100–199)
HDL: 40 mg/dL (ref >39)
LDL Chol Calc (NIH): 106 mg/dL — ABNORMAL HIGH (ref 0–99)
Triglycerides: 436 mg/dL — ABNORMAL HIGH (ref 0–149)
VLDL Cholesterol Cal: 74 mg/dL — ABNORMAL HIGH (ref 5–40)

## 2023-05-15 NOTE — Assessment & Plan Note (Signed)
Injection of Kenelog 80mg  Will refer to new orthopedic surgeon if symptoms persist or worsen

## 2023-05-15 NOTE — Assessment & Plan Note (Signed)
Uncontrolled Prescribed Ditropan 5mg  Continue to monitor symptoms

## 2023-05-15 NOTE — Assessment & Plan Note (Signed)
Well controlled.  Continue to work on eating a healthy diet and exercise.  Labs drawn today.   No major side effects reported, and no issues with compliance. The current medical regimen is effective;  continue present plan with Metformin 500mg  Will adjust medication as needed depending on labs Lab Results  Component Value Date   HGBA1C 6.6 (H) 02/08/2023   HGBA1C 6.5 (H) 10/11/2022   HGBA1C 6.4 (H) 04/06/2022

## 2023-05-15 NOTE — Assessment & Plan Note (Signed)
Well controlled.  Continue to work on eating a healthy diet and exercise.  Labs drawn today.   No major side effects reported, and no issues with compliance. The current medical regimen is effective;  continue present plan with Lotrel 10-40mg , metoprolol 25mg  Will adjust medication as needed depending on labs BP Readings from Last 3 Encounters:  05/14/23 110/80  02/08/23 (!) 136/90  12/12/22 134/74

## 2023-05-17 ENCOUNTER — Other Ambulatory Visit: Payer: Self-pay | Admitting: Physician Assistant

## 2023-05-17 DIAGNOSIS — E782 Mixed hyperlipidemia: Secondary | ICD-10-CM

## 2023-05-17 MED ORDER — ICOSAPENT ETHYL 1 G PO CAPS
2.0000 g | ORAL_CAPSULE | Freq: Two times a day (BID) | ORAL | 2 refills | Status: DC
Start: 2023-05-17 — End: 2023-09-11

## 2023-05-23 ENCOUNTER — Ambulatory Visit: Payer: BC Managed Care – PPO | Admitting: Dermatology

## 2023-06-11 ENCOUNTER — Other Ambulatory Visit: Payer: Self-pay | Admitting: Physician Assistant

## 2023-06-11 DIAGNOSIS — N3941 Urge incontinence: Secondary | ICD-10-CM

## 2023-06-22 ENCOUNTER — Other Ambulatory Visit: Payer: Self-pay | Admitting: Physician Assistant

## 2023-06-22 DIAGNOSIS — N401 Enlarged prostate with lower urinary tract symptoms: Secondary | ICD-10-CM

## 2023-06-22 DIAGNOSIS — N529 Male erectile dysfunction, unspecified: Secondary | ICD-10-CM

## 2023-06-24 ENCOUNTER — Other Ambulatory Visit: Payer: Self-pay | Admitting: Physician Assistant

## 2023-06-24 DIAGNOSIS — I10 Essential (primary) hypertension: Secondary | ICD-10-CM

## 2023-06-25 MED ORDER — TADALAFIL 10 MG PO TABS
10.0000 mg | ORAL_TABLET | Freq: Every day | ORAL | 1 refills | Status: DC
Start: 2023-06-25 — End: 2023-09-11

## 2023-06-26 ENCOUNTER — Other Ambulatory Visit: Payer: Self-pay

## 2023-06-26 DIAGNOSIS — I10 Essential (primary) hypertension: Secondary | ICD-10-CM

## 2023-07-04 ENCOUNTER — Ambulatory Visit: Payer: BC Managed Care – PPO | Admitting: Dermatology

## 2023-07-04 ENCOUNTER — Encounter: Payer: Self-pay | Admitting: Dermatology

## 2023-07-04 VITALS — BP 130/81 | HR 95

## 2023-07-04 DIAGNOSIS — L578 Other skin changes due to chronic exposure to nonionizing radiation: Secondary | ICD-10-CM

## 2023-07-04 DIAGNOSIS — L814 Other melanin hyperpigmentation: Secondary | ICD-10-CM

## 2023-07-04 DIAGNOSIS — Z1283 Encounter for screening for malignant neoplasm of skin: Secondary | ICD-10-CM

## 2023-07-04 DIAGNOSIS — B360 Pityriasis versicolor: Secondary | ICD-10-CM | POA: Diagnosis not present

## 2023-07-04 DIAGNOSIS — D492 Neoplasm of unspecified behavior of bone, soft tissue, and skin: Secondary | ICD-10-CM | POA: Diagnosis not present

## 2023-07-04 DIAGNOSIS — L820 Inflamed seborrheic keratosis: Secondary | ICD-10-CM

## 2023-07-04 DIAGNOSIS — W908XXA Exposure to other nonionizing radiation, initial encounter: Secondary | ICD-10-CM

## 2023-07-04 DIAGNOSIS — D1801 Hemangioma of skin and subcutaneous tissue: Secondary | ICD-10-CM

## 2023-07-04 DIAGNOSIS — D229 Melanocytic nevi, unspecified: Secondary | ICD-10-CM

## 2023-07-04 DIAGNOSIS — D485 Neoplasm of uncertain behavior of skin: Secondary | ICD-10-CM

## 2023-07-04 DIAGNOSIS — L821 Other seborrheic keratosis: Secondary | ICD-10-CM

## 2023-07-04 DIAGNOSIS — D225 Melanocytic nevi of trunk: Secondary | ICD-10-CM | POA: Diagnosis not present

## 2023-07-04 DIAGNOSIS — Z808 Family history of malignant neoplasm of other organs or systems: Secondary | ICD-10-CM

## 2023-07-04 MED ORDER — KETOCONAZOLE 2 % EX SHAM: 1.0000 | MEDICATED_SHAMPOO | CUTANEOUS | 3 refills | Status: DC

## 2023-07-04 NOTE — Progress Notes (Signed)
New Patient Visit   Subjective  Gregory Marks is a 62 y.o. male who presents for the following: Skin Cancer Screening and Upper Body Skin Exam  The patient presents for Upper Body Skin Exam (UBSE) for skin cancer screening and mole check. The patient has spots, moles and lesions to be evaluated, some may be new or changing and the patient may have concern these could be cancer. Pt has no hx of skin cancer but family hx of NMSC   The following portions of the chart were reviewed this encounter and updated as appropriate: medications, allergies, medical history  Review of Systems:  No other skin or systemic complaints except as noted in HPI or Assessment and Plan.  Objective  Well appearing patient in no apparent distress; mood and affect are within normal limits.  All skin waist up examined. Relevant physical exam findings are noted in the Assessment and Plan.  Mid Back Irregular brown macule         Assessment & Plan   Neoplasm of uncertain behavior of skin Mid Back  Skin / nail biopsy Type of biopsy: tangential   Timeout: patient name, date of birth, surgical site, and procedure verified   Instrument used: DermaBlade   Post-procedure details: wound care instructions given    Specimen 1 - Surgical pathology Differential Diagnosis: R/O NMSC vs MM  Check Margins: No  Tinea versicolor  Actinic skin damage  Multiple benign nevi  Seborrheic keratoses  Lentigines  Cherry angioma   Skin cancer screening performed today.  Extensive Actinic Damage - Chronic condition, secondary to cumulative UV/sun exposure - diffuse scaly erythematous macules with underlying dyspigmentation - Recommend daily broad spectrum sunscreen SPF 30+ to sun-exposed areas, reapply every 2 hours as needed.  - Staying in the shade or wearing long sleeves, sun glasses (UVA+UVB protection) and wide brim hats (4-inch brim around the entire circumference of the hat) are also recommended for sun  protection.  - Call for new or changing lesions.  Melanocytic Nevi - Tan-brown and/or pink-flesh-colored symmetric macules and papules - Benign appearing on exam today - Observation - Call clinic for new or changing moles - Recommend daily use of broad spectrum spf 30+ sunscreen to sun-exposed areas.   SEBORRHEIC KERATOSIS - Stuck-on, waxy, tan-brown papules and/or plaques  - Benign-appearing - Discussed benign etiology and prognosis. - Observe - Call for any changes  LENTIGINES Exam: scattered tan macules Due to sun exposure Treatment Plan: Benign-appearing, observe. Recommend daily broad spectrum sunscreen SPF 30+ to sun-exposed areas, reapply every 2 hours as needed.  Call for any changes   HEMANGIOMA Exam: red papule(s) Discussed benign nature. Recommend observation. Call for changes.   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 Condition is bothersome/symptomatic for patient. Currently flared.   Apply Ketoconazole cream twice a day as needed until clear. Advised it can take many months for affected areas to repigment.  Use ketoconazole shampoo as body wash once a week, especially in summer months   Tinea versicolor is a chronic recurrent skin rash causing discolored scaly spots most commonly seen on back, chest, and/or shoulders.  It is generally asymptomatic. The rash is due to overgrowth of a common type of yeast present on everyone's skin and it is not contagious.  It tends to flare more in the summer due to increased sweating on trunk.  After rash is treated, the scaliness will resolve, but the discoloration  will take longer to return to normal pigmentation. The periodic use of an OTC medicated soap/shampoo with zinc or selenium sulfide can be helpful to prevent yeast overgrowth and recurrence.  KOH positive today for short hyphae, and yeast cells are visualized in a pattern often  likened to spaghetti and meatballs    Return in about 6 weeks (around 08/15/2023) for spot check.  I, Tillie Fantasia, CMA, am acting as scribe for Gwenith Daily, MD.   Documentation: I have reviewed the above documentation for accuracy and completeness, and I agree with the above.  Gwenith Daily, MD

## 2023-07-04 NOTE — Patient Instructions (Addendum)
Patient Handout: Wound Care for Skin Biopsy Site  Taking Care of Your Skin Biopsy Site  Proper care of the biopsy site is essential for promoting healing and minimizing scarring. This handout provides instructions on how to care for your biopsy site to ensure optimal recovery.  1. Cleaning the Wound:  Clean the biopsy site daily with gentle soap and water. Gently pat the area dry with a clean, soft towel. Avoid harsh scrubbing or rubbing the area, as this can irritate the skin and delay healing.  2. Applying Aquaphor and Bandage:  After cleaning the wound, apply a thin layer of Aquaphor ointment to the biopsy site. Cover the area with a sterile bandage to protect it from dirt, bacteria, and friction. Change the bandage daily or as needed if it becomes soiled or wet.  3. Continued Care for One Week:  Repeat the cleaning, Aquaphor application, and bandaging process daily for one week following the biopsy procedure. Keeping the wound clean and moist during this initial healing period will help prevent infection and promote optimal healing.  4. Massaging Aquaphor into the Area:  ---After one week, discontinue the use of bandages but continue to apply Aquaphor to the biopsy site. ----Gently massage the Aquaphor into the area using circular motions. ---Massaging the skin helps to promote circulation and prevent the formation of scar tissue.   Additional Tips:  Avoid exposing the biopsy site to direct sunlight during the healing process, as this can cause hyperpigmentation or worsen scarring. If you experience any signs of infection, such as increased redness, swelling, warmth, or drainage from the wound, contact your healthcare provider immediately. Follow any additional instructions provided by your healthcare provider for caring for the biopsy site and managing any discomfort. Conclusion:  Taking proper care of your skin biopsy site is crucial for ensuring optimal healing and  minimizing scarring. By following these instructions for cleaning, applying Aquaphor, and massaging the area, you can promote a smooth and successful recovery. If you have any questions or concerns about caring for your biopsy site, don't hesitate to contact your healthcare provider for guidance.    Skin Education : We  counseled the patient regarding the following: Sun screen (SPF 30 or greater) should be applied during peak UV exposure (between 10am and 2pm) and reapplied after exercise or swimming.  The ABCDEs of melanoma were reviewed with the patient, and the importance of monthly self-examination of moles was emphasized. Should any moles change in shape or color, or itch, bleed or burn, pt will contact our office for evaluation sooner then their interval appointment.  Plan: Sunscreen Recommendations We recommended a broad spectrum sunscreen with a SPF of 30 or higher.  SPF 30 sunscreens block approximately 97 percent of the sun's harmful rays. Sunscreens should be applied at least 15 minutes prior to expected sun exposure and then every 2 hours after that as long as sun exposure continues. If swimming or exercising sunscreen should be reapplied every 45 minutes to an hour after getting wet or sweating. One ounce, or the equivalent of a shot glass full of sunscreen, is adequate to protect the skin not covered by a bathing suit. We also recommended a lip balm with a sunscreen as well. Sun protective clothing can be used in lieu of sunscreen but must be worn the entire time you are exposed to the sun's rays. Important Information   Due to recent changes in healthcare laws, you may see results of your pathology and/or laboratory studies on MyChart before  the doctors have had a chance to review them. We understand that in some cases there may be results that are confusing or concerning to you. Please understand that not all results are received at the same time and often the doctors may need to interpret  multiple results in order to provide you with the best plan of care or course of treatment. Therefore, we ask that you please give [place] 2 business days to thoroughly review all your results before contacting the office for clarification. Should we see a critical lab result, you will be contacted sooner.     If You Need Anything After Your Visit   If you have any questions or concerns for your doctor, please call our main line at [phone]. If no one answers, please leave a voicemail as directed and we will return your call as soon as possible. Messages left after 4 pm will be answered the following business day.    You may also send [place] a message via MyChart. We typically respond to MyChart messages within 1-2 business days.  For prescription refills, please ask your pharmacy to contact our office. Our fax number is [phone].  If you have an urgent issue when the clinic is closed that cannot wait until the next business day, you can page your doctor at the number below.     Please note that while we do our best to be available for urgent issues outside of office hours, we are not available 24/7.    If you have an urgent issue and are unable to reach [place], you may choose to seek medical care at your doctor's office, retail clinic, urgent care center, or emergency room.   If you have a medical emergency, please immediately call 911 or go to the emergency department. In the event of inclement weather, please call our main line at [phone] for an update on the status of any delays or closures.  Dermatology Medication Tips: Please keep the boxes that topical medications come in in order to help keep track of the instructions about where and how to use these. Pharmacies typically print the medication instructions only on the boxes and not directly on the medication tubes.   If your medication is too expensive, please contact our office at [phone] or send [place] a message through MyChart.     We are unable to tell what your co-pay for medications will be in advance as this is different depending on your insurance coverage. However, we may be able to find a substitute medication at lower cost or fill out paperwork to get insurance to cover a needed medication.    If a prior authorization is required to get your medication covered by your insurance company, please allow [place] 1-2 business days to complete this process.   Drug prices often vary depending on where the prescription is filled and some pharmacies may offer cheaper prices.   The website www.goodrx.com contains coupons for medications through different pharmacies. The prices here do not account for what the cost may be with help from insurance (it may be cheaper with your insurance), but the website can give you the price if you did not use any insurance.  - You can print the associated coupon and take it with your prescription to the pharmacy.  - You may also stop by our office during regular business hours and pick up a GoodRx coupon card.  - If you need your prescription sent electronically to a different pharmacy, notify  our office through [facility] or by phone at [phone]

## 2023-07-09 LAB — SURGICAL PATHOLOGY

## 2023-08-14 ENCOUNTER — Ambulatory Visit: Payer: BC Managed Care – PPO | Admitting: Dermatology

## 2023-08-23 ENCOUNTER — Ambulatory Visit: Payer: BC Managed Care – PPO | Admitting: Physician Assistant

## 2023-09-06 ENCOUNTER — Ambulatory Visit: Payer: BC Managed Care – PPO | Admitting: Physician Assistant

## 2023-09-11 ENCOUNTER — Ambulatory Visit (INDEPENDENT_AMBULATORY_CARE_PROVIDER_SITE_OTHER): Payer: No Typology Code available for payment source | Admitting: Physician Assistant

## 2023-09-11 ENCOUNTER — Encounter: Payer: Self-pay | Admitting: Physician Assistant

## 2023-09-11 VITALS — BP 150/80 | HR 89 | Temp 97.4°F | Resp 16 | Ht 70.0 in | Wt 234.0 lb

## 2023-09-11 DIAGNOSIS — N529 Male erectile dysfunction, unspecified: Secondary | ICD-10-CM

## 2023-09-11 DIAGNOSIS — R350 Frequency of micturition: Secondary | ICD-10-CM

## 2023-09-11 DIAGNOSIS — E1165 Type 2 diabetes mellitus with hyperglycemia: Secondary | ICD-10-CM

## 2023-09-11 DIAGNOSIS — E1122 Type 2 diabetes mellitus with diabetic chronic kidney disease: Secondary | ICD-10-CM | POA: Diagnosis not present

## 2023-09-11 DIAGNOSIS — K219 Gastro-esophageal reflux disease without esophagitis: Secondary | ICD-10-CM | POA: Diagnosis not present

## 2023-09-11 DIAGNOSIS — I10 Essential (primary) hypertension: Secondary | ICD-10-CM

## 2023-09-11 DIAGNOSIS — E782 Mixed hyperlipidemia: Secondary | ICD-10-CM | POA: Diagnosis not present

## 2023-09-11 DIAGNOSIS — N401 Enlarged prostate with lower urinary tract symptoms: Secondary | ICD-10-CM

## 2023-09-11 MED ORDER — OMEPRAZOLE 20 MG PO CPDR: 20.0000 mg | DELAYED_RELEASE_CAPSULE | Freq: Every day | ORAL | 1 refills | Status: DC

## 2023-09-11 MED ORDER — TADALAFIL 10 MG PO TABS: 10.0000 mg | ORAL_TABLET | Freq: Every day | ORAL | 1 refills | Status: DC

## 2023-09-11 MED ORDER — AMLODIPINE BESY-BENAZEPRIL HCL 10-40 MG PO CAPS: 1.0000 | ORAL_CAPSULE | Freq: Every day | ORAL | 1 refills | Status: DC

## 2023-09-11 MED ORDER — ICOSAPENT ETHYL 1 G PO CAPS: 2.0000 g | ORAL_CAPSULE | Freq: Two times a day (BID) | ORAL | 1 refills | Status: DC

## 2023-09-11 MED ORDER — METFORMIN HCL 500 MG PO TABS: 500.0000 mg | ORAL_TABLET | Freq: Two times a day (BID) | ORAL | 1 refills | Status: DC

## 2023-09-11 MED ORDER — METOPROLOL SUCCINATE ER 25 MG PO TB24: 25.0000 mg | ORAL_TABLET | Freq: Every day | ORAL | 1 refills | Status: DC

## 2023-09-11 NOTE — Assessment & Plan Note (Signed)
 Uncontrolled, with recent cholesterol levels fluctuating. Patient is currently on Vascepa. -Continue Vascepa. -Check lipid panel in 4 months.

## 2023-09-11 NOTE — Assessment & Plan Note (Signed)
 Controlled Continue to monitor symptoms Will adjust treatment depending on symptoms Continue taking Omeprazole 20mg  as directed

## 2023-09-11 NOTE — Assessment & Plan Note (Signed)
 Improved control with recent A1c of 6.2, down from 6.6. Patient is motivated to continue lifestyle modifications including diet and exercise to further improve glycemic control. -Continue Metformin  500mg  daily. -Encourage continued lifestyle modifications including low carbohydrate diet and regular exercise. -Check A1c in 4 months.

## 2023-09-11 NOTE — Progress Notes (Signed)
 Subjective:  Patient ID: Gregory Marks, male    DOB: 1960-10-13  Age: 63 y.o. MRN: [id number]  Chief Complaint  Patient presents with   Medical Management of Chronic Issues    HPI   Diabetes:  Patient is taking Metformin  500 mg BID.  Hyperlipidemia: Current medications: Takes Vascepa  2g BID.  Hypertension: Complications: Hyperlipidemia, Diabetes. Current medications: Amlodipine -Benazepril  10-40 mg daily, Metoprolol  25 mg daily.  Diet: cut down on sugars and breads Exercise: Tries to increase it but works till late   Oxybutnin 5mg  to help with urine frequency   Discussed the use of AI scribe software for clinical note transcription with the patient, who gave verbal consent to proceed.  History of Present Illness   A 63 year old male with a history of type 2 diabetes, high cholesterol, and kidney issues presents for a chronic follow-up. The patient reports no new complaints or concerns, but acknowledges a need to lose weight. Despite previous struggles with weight loss, the patient's most recent A1c was 6.2, down from 6.6, indicating an improvement in diabetes management.  The patient's cholesterol levels have been fluctuating, and there is concern about kidney function due to high protein levels in the urine. The patient denies any difficulty urinating but reports inconsistent frequency, with some nights requiring multiple trips to the bathroom. The patient has stopped taking medication for urinary frequency due to lack of perceived benefit.  The patient is considering lifestyle changes to improve health, including diet and exercise. He has recently purchased a treadmill and plans to start walking 30 minutes every morning. He also plans to reduce carbohydrate and sugar intake. The patient has previously lost significant weight through similar methods and is confident in his ability to do so again.  The patient also reports chronic neck and shoulder pain, which he manages with  ibuprofen. He has declined further medical intervention for this issue, preferring to manage the pain himself.          02/08/2023    1:37 PM 12/12/2022    3:11 PM 04/06/2022    8:19 AM 02/15/2021    7:54 AM 11/04/2020    2:25 PM  Depression screen PHQ 2/9  Decreased Interest 0 0 0 0 0  Down, Depressed, Hopeless 0 0 0 0 0  PHQ - 2 Score 0 0 0 0 0  Altered sleeping 0  0    Tired, decreased energy 0  0    Change in appetite 0  0    Feeling bad or failure about yourself  0  0    Trouble concentrating 0  0    Moving slowly or fidgety/restless 0  0    Suicidal thoughts 0  0    PHQ-9 Score 0  0    Difficult doing work/chores Not difficult at all  Not difficult at all          05/14/2023    2:55 PM  Fall Risk   Falls in the past year? 0  Number falls in past yr: 0  Risk for fall due to : No Fall Risks  Follow up Falls evaluation completed    Patient Care Team: Milon Cleaves, [place] as PCP - General (Physician Assistant)   Review of Systems  Constitutional:  Negative for chills, fatigue, fever and unexpected weight change.  HENT:  Negative for congestion, ear pain, sinus pain and sore throat.   Respiratory:  Negative for cough and shortness of breath.   Cardiovascular:  Negative for chest pain and  palpitations.  Gastrointestinal:  Negative for abdominal pain, blood in stool, constipation, diarrhea, nausea and vomiting.  Endocrine: Negative for polydipsia.  Genitourinary:  Negative for dysuria.  Musculoskeletal:  Positive for arthralgias and neck pain. Negative for back pain.  Skin:  Negative for rash.  Neurological:  Negative for dizziness, syncope, light-headedness and headaches.  Psychiatric/Behavioral:  Negative for agitation, behavioral problems and sleep disturbance. The patient is not nervous/anxious.     Current Outpatient Medications on File Prior to Visit  Medication Sig Dispense Refill   ketoconazole  (NIZORAL ) 2 % shampoo Apply 1 Application topically 2 (two) times a week. 120  mL 3   loratadine  (CLARITIN ) 10 MG tablet Take 1 tablet (10 mg total) by mouth daily. 90 tablet 2   No current facility-administered medications on file prior to visit.   Past Medical History:  Diagnosis Date   Allergic rhinitis    GERD (gastroesophageal reflux disease)    Hemorrhoids    Hyperlipidemia 2022   not taking statin therapy currently    Hypertension    does not see a cardiologist   Osteoarthritis    Past Surgical History:  Procedure Laterality Date   APPENDECTOMY     age 81   JOINT REPLACEMENT Right 2015   total knee arthroplasty, Dr Barbarann   KNEE ARTHROPLASTY Right 07/28/2013   Procedure: COMPUTER ASSISTED TOTAL KNEE ARTHROPLASTY;  Surgeon: Oneil JAYSON Barbarann, MD;  Location: [facility] OR;  Service: Orthopedics;  Laterality: Right;  Right Total Knee Arthroplasty   KNEE SURGERY  1976, 1992   right     Family History  Problem Relation Age of Onset   Ovarian cancer Mother    Alzheimer's disease Father    Social History   Socioeconomic History   Marital status: Married    Spouse name: Not on file   Number of children: 3   Years of education: Not on file   Highest education level: Not on file  Occupational History   Not on file  Tobacco Use   Smoking status: Every Day    Current packs/day: 0.50    Types: Cigarettes   Smokeless tobacco: Never  Vaping Use   Vaping status: Never Used  Substance and Sexual Activity   Alcohol use: Yes    Comment: reports beer every now and then   Drug use: No   Sexual activity: Yes    Partners: Female  Other Topics Concern   Not on file  Social History Narrative   Not on file   Social Drivers of Health   Financial Resource Strain: Low Risk  (04/06/2022)   Overall Financial Resource Strain (CARDIA)    Difficulty of Paying Living Expenses: Not hard at all  Food Insecurity: No Food Insecurity (04/06/2022)   Hunger Vital Sign    Worried About Running Out of Food in the Last Year: Never true    Ran Out of Food in the Last Year: Never true   Transportation Needs: No Transportation Needs (04/06/2022)   PRAPARE - Administrator, Civil Service (Medical): No    Lack of Transportation (Non-Medical): No  Physical Activity: Inactive (04/06/2022)   Exercise Vital Sign    Days of Exercise per Week: 0 days    Minutes of Exercise per Session: 0 min  Stress: No Stress Concern Present (04/06/2022)   [employer] of Occupational Health - Occupational Stress Questionnaire    Feeling of Stress : Not at all  Social Connections: Moderately Isolated (04/06/2022)   Social Connection and  Isolation Panel [NHANES]    Frequency of Communication with Friends and Family: More than three times a week    Frequency of Social Gatherings with Friends and Family: More than three times a week    Attends Religious Services: Never    Database Administrator or Organizations: No    Attends Engineer, Structural: Never    Marital Status: Married    Objective:  BP (!) 150/80   Pulse 89   Temp (!) 97.4 F (36.3 C)   Resp 16   Ht 5' 10 (1.778 m)   Wt 234 lb (106.1 kg)   SpO2 97%   BMI 33.58 kg/m      09/11/2023    4:16 PM 09/11/2023    3:27 PM 07/04/2023    3:42 PM  BP/Weight  Systolic BP 150 140 130  Diastolic BP 80 78 81  Wt. (Lbs)  234   BMI  33.58 kg/m2     Physical Exam Vitals reviewed.  Constitutional:      Appearance: Normal appearance.  Cardiovascular:     Rate and Rhythm: Normal rate and regular rhythm.     Heart sounds: Normal heart sounds.  Pulmonary:     Effort: Pulmonary effort is normal.     Breath sounds: Normal breath sounds.  Abdominal:     General: Bowel sounds are normal.     Palpations: Abdomen is soft.     Tenderness: There is no abdominal tenderness.  Neurological:     Mental Status: He is alert and oriented to person, place, and time.  Psychiatric:        Mood and Affect: Mood normal.        Behavior: Behavior normal.     Diabetic Foot Exam - Simple   Simple Foot Form Diabetic Foot exam  was performed with the following findings: Yes 09/11/2023  4:11 PM  Visual Inspection No deformities, no ulcerations, no other skin breakdown bilaterally: Yes Sensation Testing Intact to touch and monofilament testing bilaterally: Yes Pulse Check Posterior Tibialis and Dorsalis pulse intact bilaterally: Yes Comments      Lab Results  Component Value Date   WBC 9.2 05/14/2023   HGB 15.0 05/14/2023   HCT 41.1 05/14/2023   PLT 235 05/14/2023   GLUCOSE 111 (H) 05/14/2023   CHOL 220 (H) 05/14/2023   TRIG 436 (H) 05/14/2023   HDL 40 05/14/2023   LDLCALC 106 (H) 05/14/2023   ALT 30 05/14/2023   AST 20 05/14/2023   NA 139 05/14/2023   K 4.3 05/14/2023   CL 97 05/14/2023   CREATININE 1.18 05/14/2023   BUN 20 05/14/2023   CO2 24 05/14/2023   TSH 0.498 11/05/2020   INR 1.11 07/18/2013   HGBA1C 6.2 (H) 05/14/2023      Assessment & Plan:    Mixed hyperlipidemia Assessment & Plan: Uncontrolled, with recent cholesterol levels fluctuating. Patient is currently on Vascepa . -Continue Vascepa . -Check lipid panel in 4 months.  Orders: -     Lipid panel -     Icosapent  Ethyl; Take 2 capsules (2 g total) by mouth 2 (two) times daily.  Dispense: 360 capsule; Refill: 1  Essential hypertension Assessment & Plan: Recent blood pressure slightly elevated at 140/78. Patient is currently on medication for hypertension. -Check blood pressure at end of visit. -Consider medication adjustment if blood pressure remains elevated.  Orders: -     Comprehensive metabolic panel -     CBC with Differential/Platelet -  amLODIPine  Besy-Benazepril  HCl; Take 1 capsule by mouth daily.  Dispense: 90 capsule; Refill: 1 -     Metoprolol  Succinate ER; Take 1 tablet (25 mg total) by mouth daily.  Dispense: 90 tablet; Refill: 1  Type 2 diabetes mellitus with chronic kidney disease, without long-term current use of insulin, unspecified CKD stage (HCC) Assessment & Plan: Elevated proteinuria with recent  levels decreasing from 1124 to 427. Concern for potential kidney damage secondary to diabetes. -Collect urine sample today to monitor proteinuria. -Consider initiation of medication for kidney protection if proteinuria remains elevated.  Orders: -     Hemoglobin A1c -     Microalbumin / creatinine urine ratio  Gastroesophageal reflux disease, unspecified whether esophagitis present Assessment & Plan: Controlled Continue to monitor symptoms Will adjust treatment depending on symptoms Continue taking Omeprazole  20mg  as directed  Orders: -     Omeprazole ; Take 1 capsule (20 mg total) by mouth daily.  Dispense: 90 capsule; Refill: 1  Type 2 diabetes mellitus with hyperglycemia, without long-term current use of insulin (HCC) Assessment & Plan: Improved control with recent A1c of 6.2, down from 6.6. Patient is motivated to continue lifestyle modifications including diet and exercise to further improve glycemic control. -Continue Metformin  500mg  daily. -Encourage continued lifestyle modifications including low carbohydrate diet and regular exercise. -Check A1c in 4 months.  Orders: -     metFORMIN  HCl; Take 1 tablet (500 mg total) by mouth 2 (two) times daily with a meal.  Dispense: 180 tablet; Refill: 1  Erectile dysfunction, unspecified erectile dysfunction type Assessment & Plan: Controlled Denies any changes in symptoms  Denies any side effects or issues with the medicine Continue taking Tadalafil  10mg  ad prescribed  Orders: -     Tadalafil ; Take 1 tablet (10 mg total) by mouth daily.  Dispense: 90 tablet; Refill: 1  Benign prostatic hyperplasia with urinary frequency Assessment & Plan: Patient reports inconsistent nocturia, with some nights requiring multiple bathroom visits and others only one. No improvement with previous medication, which patient has discontinued. -Monitor symptoms. -Consider further evaluation if symptoms worsen or become more consistent.  Orders: -      Tadalafil ; Take 1 tablet (10 mg total) by mouth daily.  Dispense: 90 tablet; Refill: 1    Obesity Patient acknowledges need for weight loss and has a plan for diet and exercise, including walking and a low carbohydrate diet. -Encourage continued lifestyle modifications for weight loss. -Check weight and BMI in 4 months. Meds ordered this encounter  Medications   amLODipine -benazepril  (LOTREL) 10-40 MG capsule    Sig: Take 1 capsule by mouth daily.    Dispense:  90 capsule    Refill:  1    **Patient requests 90 days supply**   icosapent  Ethyl (VASCEPA ) 1 g capsule    Sig: Take 2 capsules (2 g total) by mouth 2 (two) times daily.    Dispense:  360 capsule    Refill:  1   metFORMIN  (GLUCOPHAGE ) 500 MG tablet    Sig: Take 1 tablet (500 mg total) by mouth 2 (two) times daily with a meal.    Dispense:  180 tablet    Refill:  1    **Patient requests 90 days supply**   metoprolol  succinate (TOPROL -XL) 25 MG 24 hr tablet    Sig: Take 1 tablet (25 mg total) by mouth daily.    Dispense:  90 tablet    Refill:  1   omeprazole  (PRILOSEC ) 20 MG capsule    Sig: Take  1 capsule (20 mg total) by mouth daily.    Dispense:  90 capsule    Refill:  1   tadalafil  (CIALIS ) 10 MG tablet    Sig: Take 1 tablet (10 mg total) by mouth daily.    Dispense:  90 tablet    Refill:  1    Orders Placed This Encounter  Procedures   Comprehensive metabolic panel   CBC with Differential/Platelet   Hemoglobin A1c   Lipid panel   Microalbumin / creatinine urine ratio         Follow-up: Return in about 4 months (around 01/09/2024).   I,Marla I Leal-Borjas,acting as a scribe for [employer], PA.,have documented all relevant documentation on the behalf of Nola Angles, PA,as directed by  Nola Angles, PA while in the presence of Nola Angles, [place].   An After Visit Summary was printed and given to the patient.  Nola Angles, [place] [facility] [phone]

## 2023-09-11 NOTE — Assessment & Plan Note (Signed)
 Controlled Denies any changes in symptoms  Denies any side effects or issues with the medicine Continue taking Tadalafil 10mg  ad prescribed

## 2023-09-11 NOTE — Assessment & Plan Note (Signed)
 Elevated proteinuria with recent levels decreasing from 1124 to 427. Concern for potential kidney damage secondary to diabetes. -Collect urine sample today to monitor proteinuria. -Consider initiation of medication for kidney protection if proteinuria remains elevated.

## 2023-09-11 NOTE — Assessment & Plan Note (Signed)
 Recent blood pressure slightly elevated at 140/78. Patient is currently on medication for hypertension. -Check blood pressure at end of visit. -Consider medication adjustment if blood pressure remains elevated.

## 2023-09-11 NOTE — Assessment & Plan Note (Signed)
 Patient reports inconsistent nocturia, with some nights requiring multiple bathroom visits and others only one. No improvement with previous medication, which patient has discontinued. -Monitor symptoms. -Consider further evaluation if symptoms worsen or become more consistent.

## 2023-09-12 LAB — CBC WITH DIFFERENTIAL/PLATELET
Basophils Absolute: 0.1 10*3/uL (ref 0.0–0.2)
Basos: 1 %
EOS (ABSOLUTE): 0.5 10*3/uL — ABNORMAL HIGH (ref 0.0–0.4)
Eos: 5 %
Hematocrit: 43 % (ref 37.5–51.0)
Hemoglobin: 15.1 g/dL (ref 13.0–17.7)
Immature Grans (Abs): 0.1 10*3/uL (ref 0.0–0.1)
Immature Granulocytes: 1 %
Lymphocytes Absolute: 2.8 10*3/uL (ref 0.7–3.1)
Lymphs: 28 %
MCH: 33.3 pg — ABNORMAL HIGH (ref 26.6–33.0)
MCHC: 35.1 g/dL (ref 31.5–35.7)
MCV: 95 fL (ref 79–97)
Monocytes Absolute: 0.8 10*3/uL (ref 0.1–0.9)
Monocytes: 8 %
Neutrophils Absolute: 5.7 10*3/uL (ref 1.4–7.0)
Neutrophils: 57 %
Platelets: 242 10*3/uL (ref 150–450)
RBC: 4.54 x10E6/uL (ref 4.14–5.80)
RDW: 12.1 % (ref 11.6–15.4)
WBC: 10 10*3/uL (ref 3.4–10.8)

## 2023-09-12 LAB — LIPID PANEL
Chol/HDL Ratio: 5.7 {ratio} — ABNORMAL HIGH (ref 0.0–5.0)
Cholesterol, Total: 229 mg/dL — ABNORMAL HIGH (ref 100–199)
HDL: 40 mg/dL (ref >39)
LDL Chol Calc (NIH): 109 mg/dL — ABNORMAL HIGH (ref 0–99)
Triglycerides: 462 mg/dL — ABNORMAL HIGH (ref 0–149)
VLDL Cholesterol Cal: 80 mg/dL — ABNORMAL HIGH (ref 5–40)

## 2023-09-12 LAB — HEMOGLOBIN A1C
Est. average glucose Bld gHb Est-mCnc: 146 mg/dL
Hgb A1c MFr Bld: 6.7 % — ABNORMAL HIGH (ref 4.8–5.6)

## 2023-09-12 LAB — COMPREHENSIVE METABOLIC PANEL
ALT: 34 [IU]/L (ref 0–44)
AST: 17 [IU]/L (ref 0–40)
Albumin: 4.1 g/dL (ref 3.9–4.9)
Alkaline Phosphatase: 78 [IU]/L (ref 44–121)
BUN/Creatinine Ratio: 15 (ref 10–24)
BUN: 16 mg/dL (ref 8–27)
Bilirubin Total: <0.2 mg/dL (ref 0.0–1.2)
CO2: 24 mmol/L (ref 20–29)
Calcium: 9.3 mg/dL (ref 8.6–10.2)
Chloride: 99 mmol/L (ref 96–106)
Creatinine, Ser: 1.07 mg/dL (ref 0.76–1.27)
Globulin, Total: 2.4 g/dL (ref 1.5–4.5)
Glucose: 194 mg/dL — ABNORMAL HIGH (ref 70–99)
Potassium: 4.7 mmol/L (ref 3.5–5.2)
Sodium: 136 mmol/L (ref 134–144)
Total Protein: 6.5 g/dL (ref 6.0–8.5)
eGFR: 78 mL/min/{1.73_m2} (ref >59)

## 2023-09-12 LAB — MICROALBUMIN / CREATININE URINE RATIO
Creatinine, Urine: 35.6 mg/dL
Microalb/Creat Ratio: 645 mg/g{creat} — ABNORMAL HIGH (ref 0–29)
Microalbumin, Urine: 229.5 ug/mL

## 2023-10-15 ENCOUNTER — Other Ambulatory Visit: Payer: Self-pay

## 2023-10-15 ENCOUNTER — Ambulatory Visit: Payer: BC Managed Care – PPO | Admitting: Dermatology

## 2023-10-15 DIAGNOSIS — K219 Gastro-esophageal reflux disease without esophagitis: Secondary | ICD-10-CM

## 2023-10-15 MED ORDER — OMEPRAZOLE 20 MG PO CPDR
20.0000 mg | DELAYED_RELEASE_CAPSULE | Freq: Every day | ORAL | 1 refills | Status: DC
Start: 2023-10-15 — End: 2024-01-09

## 2023-10-15 NOTE — Telephone Encounter (Signed)
 Refill sent to pharmacy.

## 2023-12-17 ENCOUNTER — Telehealth: Payer: Self-pay

## 2023-12-17 NOTE — Telephone Encounter (Signed)
 Prior Authorization was done through patient's insurance for [employer] and insurance replied back stating that the medication does not require prior authorization with the following:  This medication or product is on your plan's list of covered drugs. Prior authorization is not required at this time. If your pharmacy has questions regarding the processing of your prescription, please have them call the [facility] pharmacy help desk at [phone]. **Please note: This request was submitted electronically. Formulary lowering, tiering exception, cost reduction and/or pre-benefit determination review (including prospective Medicare hospice reviews) requests cannot be requested using this method of submission. Providers contact us  at [phone] for further assistance.

## 2024-01-08 NOTE — Progress Notes (Unsigned)
 Subjective:  Patient ID: Gregory Marks, male    DOB: 1961/06/26  Age: 63 y.o. MRN: [id number]  No chief complaint on file.   HPI:     02/08/2023    1:37 PM 12/12/2022    3:11 PM 04/06/2022    8:19 AM 02/15/2021    7:54 AM 11/04/2020    2:25 PM  Depression screen PHQ 2/9  Decreased Interest 0 0 0 0 0  Down, Depressed, Hopeless 0 0 0 0 0  PHQ - 2 Score 0 0 0 0 0  Altered sleeping 0  0    Tired, decreased energy 0  0    Change in appetite 0  0    Feeling bad or failure about yourself  0  0    Trouble concentrating 0  0    Moving slowly or fidgety/restless 0  0    Suicidal thoughts 0  0    PHQ-9 Score 0  0    Difficult doing work/chores Not difficult at all  Not difficult at all          05/14/2023    2:55 PM  Fall Risk   Falls in the past year? 0  Number falls in past yr: 0  Risk for fall due to : No Fall Risks  Follow up Falls evaluation completed    Patient Care Team: Odilia Bennett, [place] as PCP - General (Physician Assistant)   Review of Systems  Constitutional:  Negative for appetite change, fatigue and fever.  HENT:  Negative for congestion, ear pain, sinus pressure and sore throat.   Respiratory:  Negative for cough, chest tightness, shortness of breath and wheezing.   Cardiovascular:  Negative for chest pain and palpitations.  Gastrointestinal:  Negative for abdominal pain, constipation, diarrhea, nausea and vomiting.  Genitourinary:  Negative for dysuria and hematuria.  Musculoskeletal:  Negative for arthralgias, back pain, joint swelling and myalgias.  Skin:  Negative for rash.  Neurological:  Negative for dizziness, weakness and headaches.  Psychiatric/Behavioral:  Negative for dysphoric mood. The patient is not nervous/anxious.     Current Outpatient Medications on File Prior to Visit  Medication Sig Dispense Refill   amLODipine -benazepril  (LOTREL) 10-40 MG capsule Take 1 capsule by mouth daily. 90 capsule 1   icosapent  Ethyl (VASCEPA ) 1 g capsule Take 2 capsules  (2 g total) by mouth 2 (two) times daily. 360 capsule 1   ketoconazole  (NIZORAL ) 2 % shampoo Apply 1 Application topically 2 (two) times a week. 120 mL 3   loratadine  (CLARITIN ) 10 MG tablet Take 1 tablet (10 mg total) by mouth daily. 90 tablet 2   metFORMIN  (GLUCOPHAGE ) 500 MG tablet Take 1 tablet (500 mg total) by mouth 2 (two) times daily with a meal. 180 tablet 1   metoprolol  succinate (TOPROL -XL) 25 MG 24 hr tablet Take 1 tablet (25 mg total) by mouth daily. 90 tablet 1   omeprazole  (PRILOSEC ) 20 MG capsule Take 1 capsule (20 mg total) by mouth daily. 90 capsule 1   tadalafil  (CIALIS ) 10 MG tablet Take 1 tablet (10 mg total) by mouth daily. 90 tablet 1   No current facility-administered medications on file prior to visit.   Past Medical History:  Diagnosis Date   Allergic rhinitis    GERD (gastroesophageal reflux disease)    Hemorrhoids    Hyperlipidemia 2022   not taking statin therapy currently    Hypertension    does not see a cardiologist   Osteoarthritis    Past  Surgical History:  Procedure Laterality Date   APPENDECTOMY     age 63   JOINT REPLACEMENT Right 2015   total knee arthroplasty, Dr Murrel Arnt   KNEE ARTHROPLASTY Right 07/28/2013   Procedure: COMPUTER ASSISTED TOTAL KNEE ARTHROPLASTY;  Surgeon: Adah Acron, MD;  Location: [facility] OR;  Service: Orthopedics;  Laterality: Right;  Right Total Knee Arthroplasty   KNEE SURGERY  1976, 1992   right     Family History  Problem Relation Age of Onset   Ovarian cancer Mother    Alzheimer's disease Father    Social History   Socioeconomic History   Marital status: Married    Spouse name: Not on file   Number of children: 3   Years of education: Not on file   Highest education level: Not on file  Occupational History   Not on file  Tobacco Use   Smoking status: Every Day    Current packs/day: 0.50    Types: Cigarettes   Smokeless tobacco: Never  Vaping Use   Vaping status: Never Used  Substance and Sexual Activity    Alcohol use: Yes    Comment: reports beer every now and then   Drug use: No   Sexual activity: Yes    Partners: Female  Other Topics Concern   Not on file  Social History Narrative   Not on file   Social Drivers of Health   Financial Resource Strain: Low Risk  (04/06/2022)   Overall Financial Resource Strain (CARDIA)    Difficulty of Paying Living Expenses: Not hard at all  Food Insecurity: No Food Insecurity (04/06/2022)   Hunger Vital Sign    Worried About Running Out of Food in the Last Year: Never true    Ran Out of Food in the Last Year: Never true  Transportation Needs: No Transportation Needs (04/06/2022)   PRAPARE - Administrator, Civil Service (Medical): No    Lack of Transportation (Non-Medical): No  Physical Activity: Inactive (04/06/2022)   Exercise Vital Sign    Days of Exercise per Week: 0 days    Minutes of Exercise per Session: 0 min  Stress: No Stress Concern Present (04/06/2022)   [employer] of Occupational Health - Occupational Stress Questionnaire    Feeling of Stress : Not at all  Social Connections: Moderately Isolated (04/06/2022)   Social Connection and Isolation Panel [NHANES]    Frequency of Communication with Friends and Family: More than three times a week    Frequency of Social Gatherings with Friends and Family: More than three times a week    Attends Religious Services: Never    Database administrator or Organizations: No    Attends Banker Meetings: Never    Marital Status: Married    Objective:  There were no vitals taken for this visit.     09/11/2023    4:16 PM 09/11/2023    3:27 PM 07/04/2023    3:42 PM  BP/Weight  Systolic BP 150 140 130  Diastolic BP 80 78 81  Wt. (Lbs)  234   BMI  33.58 kg/m2     Physical Exam  Diabetic Foot Exam - Simple   No data filed      Lab Results  Component Value Date   WBC 10.0 09/11/2023   HGB 15.1 09/11/2023   HCT 43.0 09/11/2023   PLT 242 09/11/2023   GLUCOSE 194 (H)  09/11/2023   CHOL 229 (H) 09/11/2023   TRIG 462 (  H) 09/11/2023   HDL 40 09/11/2023   LDLCALC 109 (H) 09/11/2023   ALT 34 09/11/2023   AST 17 09/11/2023   NA 136 09/11/2023   K 4.7 09/11/2023   CL 99 09/11/2023   CREATININE 1.07 09/11/2023   BUN 16 09/11/2023   CO2 24 09/11/2023   TSH 0.498 11/05/2020   INR 1.11 07/18/2013   HGBA1C 6.7 (H) 09/11/2023      Assessment & Plan:  Type 2 diabetes mellitus with hyperglycemia, without long-term current use of insulin (HCC)  Type 2 diabetes mellitus with chronic kidney disease, without long-term current use of insulin, unspecified CKD stage (HCC)  Mixed hyperlipidemia  Essential hypertension  Gastroesophageal reflux disease, unspecified whether esophagitis present  Erectile dysfunction, unspecified erectile dysfunction type  Benign prostatic hyperplasia with urinary frequency     No orders of the defined types were placed in this encounter.   No orders of the defined types were placed in this encounter.    Follow-up: No follow-ups on file.   I,Lauren M Auman,acting as a Neurosurgeon for [employer], PA.,have documented all relevant documentation on the behalf of Odilia Bennett, PA,as directed by  Odilia Bennett, PA while in the presence of Odilia Bennett, [place].   An After Visit Summary was printed and given to the patient.  Odilia Bennett, [place] [facility] [phone]

## 2024-01-09 ENCOUNTER — Encounter: Payer: Self-pay | Admitting: Physician Assistant

## 2024-01-09 ENCOUNTER — Ambulatory Visit: Payer: No Typology Code available for payment source | Admitting: Physician Assistant

## 2024-01-09 VITALS — BP 132/82 | HR 102 | Temp 97.8°F | Ht 70.0 in | Wt 231.0 lb

## 2024-01-09 DIAGNOSIS — E1122 Type 2 diabetes mellitus with diabetic chronic kidney disease: Secondary | ICD-10-CM

## 2024-01-09 DIAGNOSIS — E1165 Type 2 diabetes mellitus with hyperglycemia: Secondary | ICD-10-CM | POA: Diagnosis not present

## 2024-01-09 DIAGNOSIS — I10 Essential (primary) hypertension: Secondary | ICD-10-CM

## 2024-01-09 DIAGNOSIS — N529 Male erectile dysfunction, unspecified: Secondary | ICD-10-CM

## 2024-01-09 DIAGNOSIS — E782 Mixed hyperlipidemia: Secondary | ICD-10-CM | POA: Diagnosis not present

## 2024-01-09 DIAGNOSIS — K219 Gastro-esophageal reflux disease without esophagitis: Secondary | ICD-10-CM

## 2024-01-09 DIAGNOSIS — N401 Enlarged prostate with lower urinary tract symptoms: Secondary | ICD-10-CM

## 2024-01-09 DIAGNOSIS — R350 Frequency of micturition: Secondary | ICD-10-CM

## 2024-01-09 MED ORDER — OMEPRAZOLE 20 MG PO CPDR: 20.0000 mg | DELAYED_RELEASE_CAPSULE | Freq: Every day | ORAL | 3 refills | Status: AC

## 2024-01-09 MED ORDER — OZEMPIC (0.25 OR 0.5 MG/DOSE) 2 MG/3ML ~~LOC~~ SOPN
0.2500 mg | PEN_INJECTOR | SUBCUTANEOUS | 0 refills | Status: DC
Start: 2024-01-09 — End: 2024-06-08

## 2024-01-09 MED ORDER — LORATADINE 10 MG PO TABS: 10.0000 mg | ORAL_TABLET | Freq: Every day | ORAL | 3 refills | Status: AC

## 2024-01-09 MED ORDER — ONDANSETRON 4 MG PO TBDP: 4.0000 mg | ORAL_TABLET | Freq: Three times a day (TID) | ORAL | 0 refills | Status: DC | PRN

## 2024-01-09 MED ORDER — AMLODIPINE BESY-BENAZEPRIL HCL 10-40 MG PO CAPS: 1.0000 | ORAL_CAPSULE | Freq: Every day | ORAL | 6 refills | Status: AC

## 2024-01-09 MED ORDER — TADALAFIL 10 MG PO TABS: 10.0000 mg | ORAL_TABLET | Freq: Every day | ORAL | 1 refills | Status: DC

## 2024-01-09 MED ORDER — METOPROLOL SUCCINATE ER 25 MG PO TB24: 25.0000 mg | ORAL_TABLET | Freq: Every day | ORAL | 3 refills | Status: AC

## 2024-01-09 MED ORDER — METFORMIN HCL 500 MG PO TABS: 500.0000 mg | ORAL_TABLET | Freq: Two times a day (BID) | ORAL | 3 refills | Status: DC

## 2024-01-09 NOTE — Patient Instructions (Signed)
 VISIT SUMMARY:  Today, we discussed your ongoing management of type 2 diabetes, hypertension, and hyperlipidemia. We addressed your concerns about medication costs and insurance coverage, and we made some adjustments to your treatment plan to help manage your conditions more effectively.  YOUR PLAN:  -TYPE 2 DIABETES MELLITUS: Your A1c level has increased to 6.7%, indicating that your blood sugar control has worsened. We will start you on Ozempic, which can help control your blood sugar and aid in weight loss. You will take 0.25 mg once a week, and we will gradually increase the dose. To help with any nausea, we will provide you with ondansetron . We will also check if we have samples of Ozempic available. Additionally, we will order an A1c test to monitor your levels, and if your A1c continues to rise, we will increase your metformin  to two pills twice daily.  -HYPERTENSION: Your blood pressure is well-controlled with amlodipine . Since your insurance prefers a 30-day supply, we will continue to prescribe it in this manner with refills.  -HYPERLIPIDEMIA: You are currently unable to obtain Zetia  due to insurance issues. We will order a lipid panel to assess your cholesterol levels and consider alternative medications that your insurance may cover.  INSTRUCTIONS:  Please follow up with the lab to complete your A1c test and lipid panel. We will review the results and adjust your treatment plan as needed. If you experience any side effects from the new medications or have any concerns, please contact our office.

## 2024-01-10 ENCOUNTER — Other Ambulatory Visit: Payer: Self-pay | Admitting: Physician Assistant

## 2024-01-10 ENCOUNTER — Encounter: Payer: Self-pay | Admitting: Physician Assistant

## 2024-01-10 ENCOUNTER — Other Ambulatory Visit: Payer: Self-pay

## 2024-01-10 DIAGNOSIS — R808 Other proteinuria: Secondary | ICD-10-CM

## 2024-01-10 LAB — COMPREHENSIVE METABOLIC PANEL WITH GFR
ALT: 43 IU/L (ref 0–44)
AST: 24 IU/L (ref 0–40)
Albumin: 4.2 g/dL (ref 3.9–4.9)
Alkaline Phosphatase: 76 IU/L (ref 44–121)
BUN/Creatinine Ratio: 16 (ref 10–24)
BUN: 21 mg/dL (ref 8–27)
Bilirubin Total: <0.2 mg/dL (ref 0.0–1.2)
CO2: 17 mmol/L — ABNORMAL LOW (ref 20–29)
Calcium: 9.8 mg/dL (ref 8.6–10.2)
Chloride: 104 mmol/L (ref 96–106)
Creatinine, Ser: 1.3 mg/dL — ABNORMAL HIGH (ref 0.76–1.27)
Globulin, Total: 2.4 g/dL (ref 1.5–4.5)
Glucose: 152 mg/dL — ABNORMAL HIGH (ref 70–99)
Potassium: 4 mmol/L (ref 3.5–5.2)
Sodium: 141 mmol/L (ref 134–144)
Total Protein: 6.6 g/dL (ref 6.0–8.5)
eGFR: 61 mL/min/{1.73_m2} (ref >59)

## 2024-01-10 LAB — CBC WITH DIFFERENTIAL/PLATELET
Basophils Absolute: 0.1 10*3/uL (ref 0.0–0.2)
Basos: 1 %
EOS (ABSOLUTE): 0.7 10*3/uL — ABNORMAL HIGH (ref 0.0–0.4)
Eos: 6 %
Hematocrit: 42.3 % (ref 37.5–51.0)
Hemoglobin: 14.8 g/dL (ref 13.0–17.7)
Immature Grans (Abs): 0.1 10*3/uL (ref 0.0–0.1)
Immature Granulocytes: 1 %
Lymphocytes Absolute: 3.2 10*3/uL — ABNORMAL HIGH (ref 0.7–3.1)
Lymphs: 29 %
MCH: 32.7 pg (ref 26.6–33.0)
MCHC: 35 g/dL (ref 31.5–35.7)
MCV: 93 fL (ref 79–97)
Monocytes Absolute: 1 10*3/uL — ABNORMAL HIGH (ref 0.1–0.9)
Monocytes: 9 %
Neutrophils Absolute: 5.8 10*3/uL (ref 1.4–7.0)
Neutrophils: 54 %
Platelets: 250 10*3/uL (ref 150–450)
RBC: 4.53 x10E6/uL (ref 4.14–5.80)
RDW: 13 % (ref 11.6–15.4)
WBC: 10.8 10*3/uL (ref 3.4–10.8)

## 2024-01-10 LAB — MICROALBUMIN / CREATININE URINE RATIO
Creatinine, Urine: 126.9 mg/dL
Microalb/Creat Ratio: 484 mg/g{creat} — ABNORMAL HIGH (ref 0–29)
Microalbumin, Urine: 614.7 ug/mL

## 2024-01-10 LAB — HEMOGLOBIN A1C
Est. average glucose Bld gHb Est-mCnc: 143 mg/dL
Hgb A1c MFr Bld: 6.6 % — ABNORMAL HIGH (ref 4.8–5.6)

## 2024-01-10 LAB — LIPID PANEL
Chol/HDL Ratio: 5.1 ratio — ABNORMAL HIGH (ref 0.0–5.0)
Cholesterol, Total: 215 mg/dL — ABNORMAL HIGH (ref 100–199)
HDL: 42 mg/dL (ref >39)
LDL Chol Calc (NIH): 113 mg/dL — ABNORMAL HIGH (ref 0–99)
Triglycerides: 344 mg/dL — ABNORMAL HIGH (ref 0–149)
VLDL Cholesterol Cal: 60 mg/dL — ABNORMAL HIGH (ref 5–40)

## 2024-01-10 NOTE — Assessment & Plan Note (Signed)
 A1c increased to 6.7%, indicating worsening control. Discussed Ozempic initiation for glucose control and weight loss. Explained side effects and insurance coverage likelihood. - Prescribe Ozempic 0.25 mg weekly, titrate dose. - Provide ondansetron  for nausea. - Check for Ozempic samples. - Order A1c test. - Increase metformin  to two pills twice daily if A1c rises.

## 2024-01-10 NOTE — Assessment & Plan Note (Signed)
 Patient reports inconsistent nocturia, with some nights requiring multiple bathroom visits and others only one. No improvement with previous medication, which patient has discontinued. -Monitor symptoms. -Consider further evaluation if symptoms worsen or become more consistent.

## 2024-01-10 NOTE — Assessment & Plan Note (Signed)
 Controlled Continue to monitor symptoms Will adjust treatment depending on symptoms Continue taking Omeprazole 20mg  as directed

## 2024-01-10 NOTE — Assessment & Plan Note (Signed)
 Well-controlled with amlodipine . No reported issues. Insurance prefers 30-day supply. - Prescribe amlodipine  30-day supply with refills. BP Readings from Last 3 Encounters:  01/09/24 132/82  09/11/23 (!) 150/80  07/04/23 130/81

## 2024-01-10 NOTE — Assessment & Plan Note (Signed)
 Unable to obtain Zetia  due to insurance. Plan to assess cholesterol and consider alternatives. - Order lipid panel. - Consider alternative medications based on insurance.  Lab Results  Component Value Date   LDLCALC 109 (H) 09/11/2023

## 2024-01-10 NOTE — Assessment & Plan Note (Signed)
 Controlled Denies any changes in symptoms  Denies any side effects or issues with the medicine Continue taking Tadalafil 10mg  ad prescribed

## 2024-04-07 ENCOUNTER — Ambulatory Visit: Admitting: Physician Assistant

## 2024-04-07 ENCOUNTER — Encounter: Payer: Self-pay | Admitting: Physician Assistant

## 2024-04-07 VITALS — BP 130/88 | HR 80 | Temp 97.1°F | Ht 70.0 in | Wt 221.0 lb

## 2024-04-07 DIAGNOSIS — I10 Essential (primary) hypertension: Secondary | ICD-10-CM

## 2024-04-07 DIAGNOSIS — E782 Mixed hyperlipidemia: Secondary | ICD-10-CM

## 2024-04-07 DIAGNOSIS — N529 Male erectile dysfunction, unspecified: Secondary | ICD-10-CM

## 2024-04-07 DIAGNOSIS — R350 Frequency of micturition: Secondary | ICD-10-CM

## 2024-04-07 DIAGNOSIS — E1122 Type 2 diabetes mellitus with diabetic chronic kidney disease: Secondary | ICD-10-CM | POA: Diagnosis not present

## 2024-04-07 DIAGNOSIS — E1165 Type 2 diabetes mellitus with hyperglycemia: Secondary | ICD-10-CM | POA: Diagnosis not present

## 2024-04-07 DIAGNOSIS — N401 Enlarged prostate with lower urinary tract symptoms: Secondary | ICD-10-CM

## 2024-04-07 DIAGNOSIS — K219 Gastro-esophageal reflux disease without esophagitis: Secondary | ICD-10-CM

## 2024-04-07 DIAGNOSIS — L729 Follicular cyst of the skin and subcutaneous tissue, unspecified: Secondary | ICD-10-CM | POA: Insufficient documentation

## 2024-04-07 MED ORDER — ONDANSETRON 4 MG PO TBDP: 4.0000 mg | ORAL_TABLET | Freq: Three times a day (TID) | ORAL | 0 refills | Status: AC | PRN

## 2024-04-07 NOTE — Assessment & Plan Note (Signed)
 Monitoring A1c for medication adjustments. - Monitor A1c levels with upcoming lab work.

## 2024-04-07 NOTE — Assessment & Plan Note (Signed)
 Controlled Continue to monitor symptoms Will adjust treatment depending on symptoms Continue taking Omeprazole 20mg  as directed

## 2024-04-07 NOTE — Progress Notes (Signed)
 Subjective:  Patient ID: Gregory Marks, male    DOB: 1960-12-15  Age: 63 y.o. MRN: [id number]  Chief Complaint  Patient presents with   Medical Management of Chronic Issues    HPI:  Discussed the use of AI scribe software for clinical note transcription with the patient, who gave verbal consent to proceed.  History of Present Illness   Gregory Marks is a 63 year old male who presents with medication management and fatigue.  He has been off his medication for about three weeks due to a refill issue. Previously, he was on a dose of 0.25 mg and has not increased it. He experienced nausea when on the medication, which made him feel sick.  He experiences significant fatigue. He feels tired after physical activities such as yard work, even when it is not excessively hot outside. He has lost ten pounds since his last visit and notes a slight decrease in waist circumference. He is currently working a lot and travels frequently for work, which he believes contributes to his fatigue. He ensures he is drinking plenty of fluids. No abdominal pain, chest pain, or swelling in his legs.  He has a concern about a lump on his head, which he has had for six to seven months. He describes it as sometimes appearing larger but not painful. He recalls hitting his head about a year ago but did not experience any blackouts or vision issues.  No recent changes in blood pressure, chest pain, or swelling in his legs. He mentions that his bowel movements are irregular, which he attributes to age.          01/09/2024    4:00 PM 02/08/2023    1:37 PM 12/12/2022    3:11 PM 04/06/2022    8:19 AM 02/15/2021    7:54 AM  Depression screen PHQ 2/9  Decreased Interest 0 0 0 0 0  Down, Depressed, Hopeless 0 0 0 0 0  PHQ - 2 Score 0 0 0 0 0  Altered sleeping  0  0   Tired, decreased energy  0  0   Change in appetite  0  0   Feeling bad or failure about yourself   0  0   Trouble concentrating  0  0   Moving slowly or  fidgety/restless  0  0   Suicidal thoughts  0  0   PHQ-9 Score  0  0   Difficult doing work/chores  Not difficult at all  Not difficult at all         01/09/2024    4:00 PM  Fall Risk   Falls in the past year? 0  Number falls in past yr: 0  Injury with Fall? 0  Risk for fall due to : No Fall Risks  Follow up Falls evaluation completed    Patient Care Team: Milon Cleaves, [place] as PCP - General (Physician Assistant)   Review of Systems  Constitutional:  Negative for appetite change, fatigue and fever.  HENT:  Negative for congestion, ear pain, sinus pressure and sore throat.   Respiratory:  Negative for cough, chest tightness, shortness of breath and wheezing.   Cardiovascular:  Negative for chest pain and palpitations.  Gastrointestinal:  Negative for abdominal pain, constipation, diarrhea, nausea and vomiting.  Genitourinary:  Negative for dysuria and hematuria.  Musculoskeletal:  Negative for arthralgias, back pain, joint swelling and myalgias.  Skin:  Negative for rash.  Neurological:  Negative for dizziness, weakness and headaches.  Psychiatric/Behavioral:  Negative for dysphoric mood. The patient is not nervous/anxious.     Current Outpatient Medications on File Prior to Visit  Medication Sig Dispense Refill   amLODipine -benazepril  (LOTREL) 10-40 MG capsule Take 1 capsule by mouth daily. 30 capsule 6   loratadine  (CLARITIN ) 10 MG tablet Take 1 tablet (10 mg total) by mouth daily. 90 tablet 3   metFORMIN  (GLUCOPHAGE ) 500 MG tablet Take 1 tablet (500 mg total) by mouth 2 (two) times daily with a meal. 180 tablet 3   metoprolol  succinate (TOPROL -XL) 25 MG 24 hr tablet Take 1 tablet (25 mg total) by mouth daily. 90 tablet 3   omeprazole  (PRILOSEC ) 20 MG capsule Take 1 capsule (20 mg total) by mouth daily. 90 capsule 3   tadalafil  (CIALIS ) 10 MG tablet Take 1 tablet (10 mg total) by mouth daily. 90 tablet 1   Semaglutide ,0.25 or 0.5MG /DOS, (OZEMPIC , 0.25 OR 0.5 MG/DOSE,) 2 MG/3ML  SOPN Inject 0.25 mg into the skin once a week. (Patient not taking: Reported on 04/07/2024) 3 mL 0   No current facility-administered medications on file prior to visit.   Past Medical History:  Diagnosis Date   Allergic rhinitis    GERD (gastroesophageal reflux disease)    Hemorrhoids    Hyperlipidemia 2022   not taking statin therapy currently    Hypertension    does not see a cardiologist   Osteoarthritis    Past Surgical History:  Procedure Laterality Date   APPENDECTOMY     age 36   JOINT REPLACEMENT Right 2015   total knee arthroplasty, Dr Barbarann   KNEE ARTHROPLASTY Right 07/28/2013   Procedure: COMPUTER ASSISTED TOTAL KNEE ARTHROPLASTY;  Surgeon: Oneil JAYSON Barbarann, MD;  Location: [facility] OR;  Service: Orthopedics;  Laterality: Right;  Right Total Knee Arthroplasty   KNEE SURGERY  1976, 1992   right     Family History  Problem Relation Age of Onset   Ovarian cancer Mother    Alzheimer's disease Father    Social History   Socioeconomic History   Marital status: Married    Spouse name: Not on file   Number of children: 3   Years of education: Not on file   Highest education level: Bachelor's degree (e.g., BA, AB, BS)  Occupational History   Not on file  Tobacco Use   Smoking status: Every Day    Current packs/day: 0.50    Types: Cigarettes   Smokeless tobacco: Never  Vaping Use   Vaping status: Never Used  Substance and Sexual Activity   Alcohol use: Yes    Comment: reports beer every now and then   Drug use: No   Sexual activity: Yes    Partners: Female  Other Topics Concern   Not on file  Social History Narrative   Not on file   Social Drivers of Health   Financial Resource Strain: Low Risk  (04/04/2024)   Overall Financial Resource Strain (CARDIA)    Difficulty of Paying Living Expenses: Not hard at all  Food Insecurity: No Food Insecurity (04/04/2024)   Hunger Vital Sign    Worried About Running Out of Food in the Last Year: Never true    Ran Out of Food in  the Last Year: Never true  Transportation Needs: No Transportation Needs (04/04/2024)   PRAPARE - Administrator, Civil Service (Medical): No    Lack of Transportation (Non-Medical): No  Physical Activity: Insufficiently Active (04/04/2024)   Exercise Vital Sign  Days of Exercise per Week: 3 days    Minutes of Exercise per Session: 30 min  Stress: No Stress Concern Present (04/04/2024)   [employer] of Occupational Health - Occupational Stress Questionnaire    Feeling of Stress: Not at all  Social Connections: Socially Integrated (04/04/2024)   Social Connection and Isolation Panel    Frequency of Communication with Friends and Family: Twice a week    Frequency of Social Gatherings with Friends and Family: Once a week    Attends Religious Services: More than 4 times per year    Active Member of [employer] or Organizations: Yes    Attends Engineer, structural: More than 4 times per year    Marital Status: Married    Objective:  BP 130/88 (BP Location: Left Arm, Patient Position: Sitting)   Pulse 80   Temp (!) 97.1 F (36.2 C) (Temporal)   Ht 5' 10 (1.778 m)   Wt 221 lb (100.2 kg)   SpO2 97%   BMI 31.71 kg/m      04/07/2024   11:42 AM 01/09/2024    3:54 PM 09/11/2023    4:16 PM  BP/Weight  Systolic BP 130 132 150  Diastolic BP 88 82 80  Wt. (Lbs) 221 231   BMI 31.71 kg/m2 33.15 kg/m2     Physical Exam Vitals reviewed.  Constitutional:      Appearance: Normal appearance.  Neck:     Vascular: No carotid bruit.  Cardiovascular:     Rate and Rhythm: Normal rate and regular rhythm.     Heart sounds: Normal heart sounds.  Pulmonary:     Effort: Pulmonary effort is normal.     Breath sounds: Normal breath sounds.  Abdominal:     General: Bowel sounds are normal.     Palpations: Abdomen is soft.     Tenderness: There is no abdominal tenderness.  Skin:    Comments: Fluctuant, mobile mass on right temple. Denies pain, skin changes, vision changes, or  lingering issues  Neurological:     Mental Status: He is alert and oriented to person, place, and time.  Psychiatric:        Mood and Affect: Mood normal.        Behavior: Behavior normal.       Lab Results  Component Value Date   WBC 10.8 01/09/2024   HGB 14.8 01/09/2024   HCT 42.3 01/09/2024   PLT 250 01/09/2024   GLUCOSE 152 (H) 01/09/2024   CHOL 215 (H) 01/09/2024   TRIG 344 (H) 01/09/2024   HDL 42 01/09/2024   LDLCALC 113 (H) 01/09/2024   ALT 43 01/09/2024   AST 24 01/09/2024   NA 141 01/09/2024   K 4.0 01/09/2024   CL 104 01/09/2024   CREATININE 1.30 (H) 01/09/2024   BUN 21 01/09/2024   CO2 17 (L) 01/09/2024   TSH 0.498 11/05/2020   INR 1.11 07/18/2013   HGBA1C 6.6 (H) 01/09/2024      Assessment & Plan:  Type 2 diabetes mellitus with hyperglycemia, without long-term current use of insulin (HCC) Assessment & Plan: Monitoring A1c for medication adjustments. - Monitor A1c levels with upcoming lab work.  Orders: -     Hemoglobin A1c -     Ondansetron ; Take 1 tablet (4 mg total) by mouth every 8 (eight) hours as needed for nausea or vomiting.  Dispense: 20 tablet; Refill: 0 -     Comprehensive metabolic panel with GFR; Future -  CBC with Differential/Platelet; Future  Type 2 diabetes mellitus with chronic kidney disease, without long-term current use of insulin, unspecified CKD stage (HCC) Assessment & Plan: Monitoring A1c for medication adjustments. - Monitor A1c levels with upcoming lab work.  Orders: -     B12 and Folate Panel; Future  Mixed hyperlipidemia Assessment & Plan: Hyperlipidemia with statin intolerance due to joint pain. Discussed potential need for statins if cardiovascular risk is high.  Orders: -     Lipid panel; Future  Essential hypertension Assessment & Plan: Well-controlled with amlodipine . No reported issues. Insurance prefers 30-day supply. - Prescribe amlodipine  30-day supply with refills. BP Readings from Last 3  Encounters:  04/07/24 130/88  01/09/24 132/82  09/11/23 (!) 150/80      Gastroesophageal reflux disease, unspecified whether esophagitis present Assessment & Plan: Controlled Continue to monitor symptoms Will adjust treatment depending on symptoms Continue taking Omeprazole  20mg  as directed   Erectile dysfunction, unspecified erectile dysfunction type Assessment & Plan: Fatigue. Differential includes low testosterone , anemia, or cardiovascular issues. Cardiovascular testing deferred. - Order blood tests for testosterone , B12, folate, and complete blood count. - Schedule morning lab visit for blood draw.  Orders: -     Testosterone ,Free and Total; Future  Benign prostatic hyperplasia with urinary frequency Assessment & Plan: Patient reports inconsistent nocturia, with some nights requiring multiple bathroom visits and others only one. No improvement with previous medication, which patient has discontinued. -Monitor symptoms. -Consider further evaluation if symptoms worsen or become more consistent.   Subcutaneous cyst Assessment & Plan: Non-painful, mobile, firm mass on right temple, likely lipoma or cyst. - Monitor mass for changes in size or development of symptoms.     Meds ordered this encounter  Medications   ondansetron  (ZOFRAN -ODT) 4 MG disintegrating tablet    Sig: Take 1 tablet (4 mg total) by mouth every 8 (eight) hours as needed for nausea or vomiting.    Dispense:  20 tablet    Refill:  0    Orders Placed This Encounter  Procedures   Hemoglobin A1c   Comprehensive metabolic panel with GFR   CBC with Differential/Platelet   B12 and Folate Panel   Lipid panel   Testosterone ,Free and Total     Follow-up: Return in about 3 months (around 07/08/2024) for Chronic, Nola, lab visit.   I,Lauren M Auman,acting as a Neurosurgeon for [employer], [employer].,have documented all relevant documentation on the behalf of Nola Angles, PA,as directed by  Nola Angles, PA while in  the presence of Nola Angles, [place].   An After Visit Summary was printed and given to the patient.  Nola Angles, [place] [facility] [phone]

## 2024-04-07 NOTE — Assessment & Plan Note (Signed)
 Patient reports inconsistent nocturia, with some nights requiring multiple bathroom visits and others only one. No improvement with previous medication, which patient has discontinued. -Monitor symptoms. -Consider further evaluation if symptoms worsen or become more consistent.

## 2024-04-07 NOTE — Patient Instructions (Signed)
 VISIT SUMMARY:  Today, we discussed your medication management and addressed your concerns about fatigue and a lump on your head. We also reviewed your diabetes management and other health issues.  YOUR PLAN:  -FATIGUE: Fatigue can be caused by various factors such as low testosterone , anemia, or heart issues. We will conduct blood tests to check your testosterone , B12, folate levels, and a complete blood count. Please schedule a morning lab visit for the blood draw.  -TYPE 2 DIABETES MELLITUS: Type 2 diabetes is a condition where your body does not use insulin properly, leading to high blood sugar levels. We will monitor your A1c levels with your upcoming lab work to adjust your medication as needed.  -NAUSEA SECONDARY TO DIABETES MEDICATION: You have been experiencing nausea after resuming your diabetes medication. To minimize this, we will start you on a lower dose of 0.25 mg. Additionally, we have prescribed Zofran  to help with the nausea. The prescription has been sent to [facility] in [place].  -HYPERLIPIDEMIA, STATIN INTOLERANCE: Hyperlipidemia is a condition where you have high levels of fats in your blood. You have had joint pain with statins, which are medications used to lower cholesterol. We discussed the potential need for statins if your cardiovascular risk is high.  -SUBCUTANEOUS MASS OF RIGHT TEMPLE, LIKELY LIPOMA OR CYST: You have a non-painful, mobile, firm mass on your right temple, which is likely a lipoma (a benign fatty lump) or a cyst. We will monitor the mass for any changes in size or symptoms.  INSTRUCTIONS:  Please schedule a morning lab visit for your blood tests. Follow up with us  after the lab results are available to discuss the next steps.

## 2024-04-07 NOTE — Assessment & Plan Note (Signed)
 Fatigue. Differential includes low testosterone , anemia, or cardiovascular issues. Cardiovascular testing deferred. - Order blood tests for testosterone , B12, folate, and complete blood count. - Schedule morning lab visit for blood draw.

## 2024-04-07 NOTE — Assessment & Plan Note (Signed)
 Hyperlipidemia with statin intolerance due to joint pain. Discussed potential need for statins if cardiovascular risk is high.

## 2024-04-07 NOTE — Assessment & Plan Note (Signed)
 Non-painful, mobile, firm mass on right temple, likely lipoma or cyst. - Monitor mass for changes in size or development of symptoms.

## 2024-04-07 NOTE — Assessment & Plan Note (Signed)
 Well-controlled with amlodipine . No reported issues. Insurance prefers 30-day supply. - Prescribe amlodipine  30-day supply with refills. BP Readings from Last 3 Encounters:  04/07/24 130/88  01/09/24 132/82  09/11/23 (!) 150/80

## 2024-04-11 ENCOUNTER — Ambulatory Visit: Admitting: Physician Assistant

## 2024-06-01 ENCOUNTER — Other Ambulatory Visit: Payer: Self-pay | Admitting: Physician Assistant

## 2024-06-01 DIAGNOSIS — N401 Enlarged prostate with lower urinary tract symptoms: Secondary | ICD-10-CM

## 2024-06-01 DIAGNOSIS — N529 Male erectile dysfunction, unspecified: Secondary | ICD-10-CM

## 2024-06-07 ENCOUNTER — Other Ambulatory Visit: Payer: Self-pay | Admitting: Physician Assistant

## 2024-06-07 DIAGNOSIS — E1165 Type 2 diabetes mellitus with hyperglycemia: Secondary | ICD-10-CM

## 2024-07-08 ENCOUNTER — Other Ambulatory Visit: Payer: Self-pay

## 2024-07-08 DIAGNOSIS — E1122 Type 2 diabetes mellitus with diabetic chronic kidney disease: Secondary | ICD-10-CM

## 2024-07-08 DIAGNOSIS — I10 Essential (primary) hypertension: Secondary | ICD-10-CM

## 2024-07-08 DIAGNOSIS — E782 Mixed hyperlipidemia: Secondary | ICD-10-CM

## 2024-07-09 ENCOUNTER — Other Ambulatory Visit

## 2024-07-09 DIAGNOSIS — E782 Mixed hyperlipidemia: Secondary | ICD-10-CM

## 2024-07-09 DIAGNOSIS — E1122 Type 2 diabetes mellitus with diabetic chronic kidney disease: Secondary | ICD-10-CM

## 2024-07-09 DIAGNOSIS — I10 Essential (primary) hypertension: Secondary | ICD-10-CM

## 2024-07-09 LAB — LIPID PANEL
Chol/HDL Ratio: 5.2 ratio — ABNORMAL HIGH (ref 0.0–5.0)
Cholesterol, Total: 234 mg/dL — ABNORMAL HIGH (ref 100–199)
HDL: 45 mg/dL (ref >39)
LDL Chol Calc (NIH): 153 mg/dL — ABNORMAL HIGH (ref 0–99)
Triglycerides: 198 mg/dL — ABNORMAL HIGH (ref 0–149)
VLDL Cholesterol Cal: 36 mg/dL (ref 5–40)

## 2024-07-09 LAB — CBC WITH DIFFERENTIAL/PLATELET
Basophils Absolute: 0.1 x10E3/uL (ref 0.0–0.2)
Basos: 1 %
EOS (ABSOLUTE): 0.7 x10E3/uL — ABNORMAL HIGH (ref 0.0–0.4)
Eos: 7 %
Hematocrit: 45.7 % (ref 37.5–51.0)
Hemoglobin: 15.8 g/dL (ref 13.0–17.7)
Immature Grans (Abs): 0.1 x10E3/uL (ref 0.0–0.1)
Immature Granulocytes: 1 %
Lymphocytes Absolute: 2.6 x10E3/uL (ref 0.7–3.1)
Lymphs: 28 %
MCH: 32.9 pg (ref 26.6–33.0)
MCHC: 34.6 g/dL (ref 31.5–35.7)
MCV: 95 fL (ref 79–97)
Monocytes Absolute: 0.9 x10E3/uL (ref 0.1–0.9)
Monocytes: 10 %
Neutrophils Absolute: 4.9 x10E3/uL (ref 1.4–7.0)
Neutrophils: 53 %
Platelets: 227 x10E3/uL (ref 150–450)
RBC: 4.8 x10E6/uL (ref 4.14–5.80)
RDW: 12.4 % (ref 11.6–15.4)
WBC: 9.3 x10E3/uL (ref 3.4–10.8)

## 2024-07-09 LAB — COMPREHENSIVE METABOLIC PANEL WITH GFR
ALT: 52 IU/L — ABNORMAL HIGH (ref 0–44)
AST: 33 IU/L (ref 0–40)
Albumin: 4.3 g/dL (ref 3.9–4.9)
Alkaline Phosphatase: 79 IU/L (ref 47–123)
BUN/Creatinine Ratio: 15 (ref 10–24)
BUN: 16 mg/dL (ref 8–27)
Bilirubin Total: 0.3 mg/dL (ref 0.0–1.2)
CO2: 24 mmol/L (ref 20–29)
Calcium: 9.3 mg/dL (ref 8.6–10.2)
Chloride: 100 mmol/L (ref 96–106)
Creatinine, Ser: 1.08 mg/dL (ref 0.76–1.27)
Globulin, Total: 2.7 g/dL (ref 1.5–4.5)
Glucose: 212 mg/dL — ABNORMAL HIGH (ref 70–99)
Potassium: 4.3 mmol/L (ref 3.5–5.2)
Sodium: 138 mmol/L (ref 134–144)
Total Protein: 7 g/dL (ref 6.0–8.5)
eGFR: 77 mL/min/1.73 (ref >59)

## 2024-07-09 LAB — HEMOGLOBIN A1C
Est. average glucose Bld gHb Est-mCnc: 154 mg/dL
Hgb A1c MFr Bld: 7 % — ABNORMAL HIGH (ref 4.8–5.6)

## 2024-07-11 ENCOUNTER — Encounter: Payer: Self-pay | Admitting: Physician Assistant

## 2024-07-11 ENCOUNTER — Ambulatory Visit: Admitting: Physician Assistant

## 2024-07-11 VITALS — BP 132/82 | HR 91 | Temp 97.8°F | Ht 70.0 in | Wt 227.0 lb

## 2024-07-11 DIAGNOSIS — T466X5A Adverse effect of antihyperlipidemic and antiarteriosclerotic drugs, initial encounter: Secondary | ICD-10-CM | POA: Diagnosis not present

## 2024-07-11 DIAGNOSIS — N529 Male erectile dysfunction, unspecified: Secondary | ICD-10-CM

## 2024-07-11 DIAGNOSIS — E782 Mixed hyperlipidemia: Secondary | ICD-10-CM | POA: Diagnosis not present

## 2024-07-11 DIAGNOSIS — D2321 Other benign neoplasm of skin of right ear and external auricular canal: Secondary | ICD-10-CM | POA: Diagnosis not present

## 2024-07-11 DIAGNOSIS — R350 Frequency of micturition: Secondary | ICD-10-CM

## 2024-07-11 DIAGNOSIS — H6123 Impacted cerumen, bilateral: Secondary | ICD-10-CM | POA: Diagnosis not present

## 2024-07-11 DIAGNOSIS — K219 Gastro-esophageal reflux disease without esophagitis: Secondary | ICD-10-CM

## 2024-07-11 DIAGNOSIS — I10 Essential (primary) hypertension: Secondary | ICD-10-CM

## 2024-07-11 DIAGNOSIS — N401 Enlarged prostate with lower urinary tract symptoms: Secondary | ICD-10-CM

## 2024-07-11 DIAGNOSIS — E1122 Type 2 diabetes mellitus with diabetic chronic kidney disease: Secondary | ICD-10-CM

## 2024-07-11 DIAGNOSIS — G720 Drug-induced myopathy: Secondary | ICD-10-CM | POA: Diagnosis not present

## 2024-07-11 MED ORDER — METFORMIN HCL 1000 MG PO TABS: 1000.0000 mg | ORAL_TABLET | Freq: Two times a day (BID) | ORAL | 3 refills | Status: AC

## 2024-07-11 NOTE — Progress Notes (Signed)
 "  Subjective:  Patient ID: Gregory Marks, male    DOB: July 02, 1961  Age: 63 y.o. MRN: [id number]  Chief Complaint  Patient presents with   Medical Management of Chronic Issues    HPI: Discussed the use of AI scribe software for clinical note transcription with the patient, who gave verbal consent to proceed.  History of Present Illness Gregory Marks is a 63 year old male with type 2 diabetes and hyperlipidemia who presents for a chronic follow-up visit.  He discontinued Ozempic  due to adverse effects, including increased appetite and severe constipation, which exacerbated his hemorrhoids. He feels better on metformin , currently taking 500 mg twice daily, totaling 1000 mg per day. His A1c has increased from 6.6% to 7.0%.  He experiences frequent nocturia, waking up multiple times per night, which disrupts his sleep. He has reduced his intake of diet Pepsi to 2-3 bottles per day and avoids drinking at night. No hematuria and he feels he fully empties his bladder each time. He has a history of high protein levels in his urine.  He mentions a recurring issue with his ears, describing a film-like drainage that dries up and forms a scab. He has a lump behind his ear, similar to a previous cyst that was drained. No recent ear infections and no pain, but the lump can be bothersome.         07/11/2024   10:55 AM 01/09/2024    4:00 PM 02/08/2023    1:37 PM 12/12/2022    3:11 PM 04/06/2022    8:19 AM  Depression screen PHQ 2/9  Decreased Interest 0 0 0 0 0  Down, Depressed, Hopeless 0 0 0 0 0  PHQ - 2 Score 0 0 0 0 0  Altered sleeping   0  0  Tired, decreased energy   0  0  Change in appetite   0  0  Feeling bad or failure about yourself    0  0  Trouble concentrating   0  0  Moving slowly or fidgety/restless   0  0  Suicidal thoughts   0  0  PHQ-9 Score   0   0   Difficult doing work/chores   Not difficult at all  Not difficult at all     Data saved with a previous flowsheet row  definition        07/11/2024   10:54 AM  Fall Risk   Falls in the past year? 0  Number falls in past yr: 0  Injury with Fall? 0  Risk for fall due to : No Fall Risks  Follow up Falls evaluation completed    Patient Care Team: Milon Cleaves, [place] as PCP - General (Physician Assistant)   Review of Systems  Constitutional:  Negative for appetite change, fatigue and fever.  HENT:  Negative for congestion, ear pain, sinus pressure and sore throat.   Respiratory:  Negative for cough, chest tightness, shortness of breath and wheezing.   Cardiovascular:  Negative for chest pain and palpitations.  Gastrointestinal:  Negative for abdominal pain, constipation, diarrhea, nausea and vomiting.  Genitourinary:  Negative for dysuria and hematuria.  Musculoskeletal:  Negative for arthralgias, back pain, joint swelling and myalgias.  Skin:  Negative for rash.  Neurological:  Negative for dizziness, weakness and headaches.  Psychiatric/Behavioral:  Negative for dysphoric mood. The patient is not nervous/anxious.     Current Outpatient Medications on File Prior to Visit  Medication Sig Dispense Refill   amLODipine -benazepril  (  LOTREL) 10-40 MG capsule Take 1 capsule by mouth daily. 30 capsule 6   loratadine  (CLARITIN ) 10 MG tablet Take 1 tablet (10 mg total) by mouth daily. 90 tablet 3   metoprolol  succinate (TOPROL -XL) 25 MG 24 hr tablet Take 1 tablet (25 mg total) by mouth daily. 90 tablet 3   omeprazole  (PRILOSEC ) 20 MG capsule Take 1 capsule (20 mg total) by mouth daily. 90 capsule 3   ondansetron  (ZOFRAN -ODT) 4 MG disintegrating tablet Take 1 tablet (4 mg total) by mouth every 8 (eight) hours as needed for nausea or vomiting. 20 tablet 0   tadalafil  (CIALIS ) 10 MG tablet Take 1 tablet by mouth once daily 90 tablet 0   No current facility-administered medications on file prior to visit.   Past Medical History:  Diagnosis Date   Allergic rhinitis    GERD (gastroesophageal reflux disease)     Hemorrhoids    Hyperlipidemia 2022   not taking statin therapy currently    Hypertension    does not see a cardiologist   Osteoarthritis    Past Surgical History:  Procedure Laterality Date   APPENDECTOMY     age 89   JOINT REPLACEMENT Right 2015   total knee arthroplasty, Dr Barbarann   KNEE ARTHROPLASTY Right 07/28/2013   Procedure: COMPUTER ASSISTED TOTAL KNEE ARTHROPLASTY;  Surgeon: Oneil JAYSON Barbarann, MD;  Location: [facility] OR;  Service: Orthopedics;  Laterality: Right;  Right Total Knee Arthroplasty   KNEE SURGERY  1976, 1992   right     Family History  Problem Relation Age of Onset   Ovarian cancer Mother    Alzheimer's disease Father    Social History   Socioeconomic History   Marital status: Married    Spouse name: Not on file   Number of children: 3   Years of education: Not on file   Highest education level: Bachelor's degree (e.g., BA, AB, BS)  Occupational History   Not on file  Tobacco Use   Smoking status: Every Day    Current packs/day: 0.50    Types: Cigarettes   Smokeless tobacco: Never  Vaping Use   Vaping status: Never Used  Substance and Sexual Activity   Alcohol use: Yes    Comment: reports beer every now and then   Drug use: No   Sexual activity: Yes    Partners: Female  Other Topics Concern   Not on file  Social History Narrative   Not on file   Social Drivers of Health   Financial Resource Strain: Low Risk  (07/08/2024)   Overall Financial Resource Strain (CARDIA)    Difficulty of Paying Living Expenses: Not hard at all  Food Insecurity: No Food Insecurity (07/08/2024)   Hunger Vital Sign    Worried About Running Out of Food in the Last Year: Never true    Ran Out of Food in the Last Year: Never true  Transportation Needs: No Transportation Needs (07/08/2024)   PRAPARE - Administrator, Civil Service (Medical): No    Lack of Transportation (Non-Medical): No  Physical Activity: Insufficiently Active (07/08/2024)   Exercise Vital Sign     Days of Exercise per Week: 2 days    Minutes of Exercise per Session: 30 min  Stress: No Stress Concern Present (07/08/2024)   [employer] of Occupational Health - Occupational Stress Questionnaire    Feeling of Stress: Only a little  Social Connections: Socially Integrated (07/08/2024)   Social Connection and Isolation Panel  Frequency of Communication with Friends and Family: Twice a week    Frequency of Social Gatherings with Friends and Family: Once a week    Attends Religious Services: More than 4 times per year    Active Member of [employer] or Organizations: Yes    Attends Engineer, Structural: More than 4 times per year    Marital Status: Married    Objective:  BP 132/82 (BP Location: Left Arm, Patient Position: Sitting)   Pulse 91   Temp 97.8 F (36.6 C) (Temporal)   Ht 5' 10 (1.778 m)   Wt 227 lb (103 kg)   SpO2 98%   BMI 32.57 kg/m      07/11/2024   10:53 AM 04/07/2024   11:42 AM 01/09/2024    3:54 PM  BP/Weight  Systolic BP 132 130 132  Diastolic BP 82 88 82  Wt. (Lbs) 227 221 231  BMI 32.57 kg/m2 31.71 kg/m2 33.15 kg/m2    Physical Exam Vitals reviewed.  Constitutional:      Appearance: Normal appearance.  Cardiovascular:     Rate and Rhythm: Normal rate and regular rhythm.     Heart sounds: Normal heart sounds.  Pulmonary:     Effort: Pulmonary effort is normal.     Breath sounds: Normal breath sounds.  Abdominal:     General: Bowel sounds are normal.     Palpations: Abdomen is soft.     Tenderness: There is no abdominal tenderness.  Skin:    General: Skin is warm.     Findings: Abscess present.     Comments: Behind right ear. Admits to previous history of them.   Neurological:     Mental Status: He is alert and oriented to person, place, and time.  Psychiatric:        Mood and Affect: Mood normal.        Behavior: Behavior normal.         Lab Results  Component Value Date   WBC 9.3 07/09/2024   HGB 15.8 07/09/2024   HCT 45.7  07/09/2024   PLT 227 07/09/2024   GLUCOSE 212 (H) 07/09/2024   CHOL 234 (H) 07/09/2024   TRIG 198 (H) 07/09/2024   HDL 45 07/09/2024   LDLCALC 153 (H) 07/09/2024   ALT 52 (H) 07/09/2024   AST 33 07/09/2024   NA 138 07/09/2024   K 4.3 07/09/2024   CL 100 07/09/2024   CREATININE 1.08 07/09/2024   BUN 16 07/09/2024   CO2 24 07/09/2024   TSH 0.498 11/05/2020   INR 1.11 07/18/2013   HGBA1C 7.0 (H) 07/09/2024    Results for orders placed or performed in visit on 07/09/24  Lipid panel   Collection Time: 07/09/24  7:51 AM  Result Value Ref Range   Cholesterol, Total 234 (H) 100 - 199 mg/dL   Triglycerides 801 (H) 0 - 149 mg/dL   HDL 45 >60 mg/dL   VLDL Cholesterol Cal 36 5 - 40 mg/dL   LDL Chol Calc (NIH) 846 (H) 0 - 99 mg/dL   Chol/HDL Ratio 5.2 (H) 0.0 - 5.0 ratio  Hemoglobin A1c   Collection Time: 07/09/24  7:51 AM  Result Value Ref Range   Hgb A1c MFr Bld 7.0 (H) 4.8 - 5.6 %   Est. average glucose Bld gHb Est-mCnc 154 mg/dL  Comprehensive metabolic panel with GFR   Collection Time: 07/09/24  7:51 AM  Result Value Ref Range   Glucose 212 (H) 70 - 99 mg/dL  BUN 16 8 - 27 mg/dL   Creatinine, Ser 8.91 0.76 - 1.27 mg/dL   eGFR 77 >40 fO/fpw/8.26   BUN/Creatinine Ratio 15 10 - 24   Sodium 138 134 - 144 mmol/L   Potassium 4.3 3.5 - 5.2 mmol/L   Chloride 100 96 - 106 mmol/L   CO2 24 20 - 29 mmol/L   Calcium  9.3 8.6 - 10.2 mg/dL   Total Protein 7.0 6.0 - 8.5 g/dL   Albumin 4.3 3.9 - 4.9 g/dL   Globulin, Total 2.7 1.5 - 4.5 g/dL   Bilirubin Total 0.3 0.0 - 1.2 mg/dL   Alkaline Phosphatase 79 47 - 123 IU/L   AST 33 0 - 40 IU/L   ALT 52 (H) 0 - 44 IU/L  CBC with Differential/Platelet   Collection Time: 07/09/24  7:51 AM  Result Value Ref Range   WBC 9.3 3.4 - 10.8 x10E3/uL   RBC 4.80 4.14 - 5.80 x10E6/uL   Hemoglobin 15.8 13.0 - 17.7 g/dL   Hematocrit 54.2 62.4 - 51.0 %   MCV 95 79 - 97 fL   MCH 32.9 26.6 - 33.0 pg   MCHC 34.6 31.5 - 35.7 g/dL   RDW 87.5 88.3 - 84.5  %   Platelets 227 150 - 450 x10E3/uL   Neutrophils 53 Not Estab. %   Lymphs 28 Not Estab. %   Monocytes 10 Not Estab. %   Eos 7 Not Estab. %   Basos 1 Not Estab. %   Neutrophils Absolute 4.9 1.4 - 7.0 x10E3/uL   Lymphocytes Absolute 2.6 0.7 - 3.1 x10E3/uL   Monocytes Absolute 0.9 0.1 - 0.9 x10E3/uL   EOS (ABSOLUTE) 0.7 (H) 0.0 - 0.4 x10E3/uL   Basophils Absolute 0.1 0.0 - 0.2 x10E3/uL   Immature Granulocytes 1 Not Estab. %   Immature Grans (Abs) 0.1 0.0 - 0.1 x10E3/uL  . I & D  Date/Time: 07/11/2024 12:14 PM  Performed by: Milon Cleaves, PA Authorized by: Milon Cleaves, PA   Consent:    Consent obtained:  Verbal   Consent given by:  Patient   Risks, benefits, and alternatives were discussed: yes     Risks discussed:  Bleeding, incomplete drainage, pain and infection   Alternatives discussed:  Delayed treatment and referral Location:    Type:  Abscess   Location:  Head   Head location:  R external ear Pre-procedure details:    Skin preparation:  Alcohol Anesthesia:    Anesthesia method:  Local infiltration   Local anesthetic:  Lidocaine  1% w/o epi Procedure type:    Complexity:  Simple Procedure details:    Needle aspiration: yes     Needle size:  20 G   Incision types:  Stab incision   Incision depth:  Dermal   Wound management:  Probed and deloculated   Drainage:  Bloody   Drainage amount:  Scant   Packing materials:  None Post-procedure details:    Procedure completion:  Tolerated well, no immediate complications   Assessment & Plan:   Assessment & Plan Type 2 diabetes mellitus with chronic kidney disease, without long-term current use of insulin, unspecified CKD stage (HCC) Type 2 diabetes mellitus with diabetic chronic kidney disease A1c increased from 6.6 to 7.0. Previous Ozempic  discontinued due to side effects. Metformin  preferred. - Increased metformin  to 1000 mg twice daily. - Monitor A1c levels. Orders:   metFORMIN  (GLUCOPHAGE ) 1000 MG tablet; Take 1  tablet (1,000 mg total) by mouth 2 (two) times daily with a meal.  Mixed hyperlipidemia LDL cholesterol increased from 113 to 130. Liver function tests slightly elevated, likely related to cholesterol. - Encouraged dietary modifications and increased physical activity to lower LDL cholesterol.    Essential hypertension Well-controlled with amlodipine . No reported issues. Insurance prefers 30-day supply. - Prescribe amlodipine  30-day supply with refills. BP Readings from Last 3 Encounters:  07/11/24 132/82  04/07/24 130/88  01/09/24 132/82        Erectile dysfunction, unspecified erectile dysfunction type Fatigue. Differential includes low testosterone , anemia, or cardiovascular issues. Cardiovascular testing deferred. -continue Tadalafil .     Benign prostatic hyperplasia with urinary frequency Benign prostatic hyperplasia with lower urinary tract symptoms and overactive bladder Frequent nocturia. Differential includes overactive bladder and UTI. Gemcitabine discussed with potential urinary retention. Urology referral considered if symptoms persist. - Provided gemcitabine samples for trial use. - Instructed to monitor for urinary retention as a side effect. - Instructed to collect urine sample to rule out UTI. - Will consider urology referral if symptoms persist or worsen.    Statin myopathy Patient tried statin and Zetia  in past and failed  Do not want to try Repatha    Gastroesophageal reflux disease, unspecified whether esophagitis present Controlled Continue to monitor symptoms Will adjust treatment depending on symptoms Continue taking Omeprazole  20mg  as directed     Dermoid cyst of right ear Recurrent cystic lesion with previous drainage. No current pain or infection. Orders:   I & D  Bilateral impacted cerumen Significant earwax buildup causing discomfort. - Cleaned ears to remove cerumen impaction.    Body mass index is 32.57 kg/m.    Meds ordered this  encounter  Medications   metFORMIN  (GLUCOPHAGE ) 1000 MG tablet    Sig: Take 1 tablet (1,000 mg total) by mouth 2 (two) times daily with a meal.    Dispense:  180 tablet    Refill:  3    Orders Placed This Encounter  Procedures   I & D     I,Marla I Leal-Borjas,acting as a scribe for [employer], PA.,have documented all relevant documentation on the behalf of Nola Angles, PA,as directed by  Nola Angles, PA while in the presence of Nola Angles, [place].   Follow-up: No follow-ups on file.  An After Visit Summary was printed and given to the patient.   Nola Angles, [place] [facility] [phone] "

## 2024-07-11 NOTE — Assessment & Plan Note (Addendum)
 Patient tried statin and Zetia  in past and failed  Do not want to try Repatha

## 2024-07-11 NOTE — Assessment & Plan Note (Addendum)
 Fatigue. Differential includes low testosterone , anemia, or cardiovascular issues. Cardiovascular testing deferred. -continue Tadalafil .

## 2024-07-11 NOTE — Assessment & Plan Note (Addendum)
 LDL cholesterol increased from 113 to 130. Liver function tests slightly elevated, likely related to cholesterol. - Encouraged dietary modifications and increased physical activity to lower LDL cholesterol.

## 2024-07-11 NOTE — Assessment & Plan Note (Addendum)
 Benign prostatic hyperplasia with lower urinary tract symptoms and overactive bladder Frequent nocturia. Differential includes overactive bladder and UTI. Gemcitabine discussed with potential urinary retention. Urology referral considered if symptoms persist. - Provided gemcitabine samples for trial use. - Instructed to monitor for urinary retention as a side effect. - Instructed to collect urine sample to rule out UTI. - Will consider urology referral if symptoms persist or worsen.

## 2024-07-11 NOTE — Assessment & Plan Note (Addendum)
 Type 2 diabetes mellitus with diabetic chronic kidney disease A1c increased from 6.6 to 7.0. Previous Ozempic  discontinued due to side effects. Metformin  preferred. - Increased metformin  to 1000 mg twice daily. - Monitor A1c levels. Orders:   metFORMIN  (GLUCOPHAGE ) 1000 MG tablet; Take 1 tablet (1,000 mg total) by mouth 2 (two) times daily with a meal.

## 2024-07-11 NOTE — Assessment & Plan Note (Addendum)
 Controlled Continue to monitor symptoms Will adjust treatment depending on symptoms Continue taking Omeprazole 20mg  as directed

## 2024-07-11 NOTE — Assessment & Plan Note (Addendum)
 Well-controlled with amlodipine . No reported issues. Insurance prefers 30-day supply. - Prescribe amlodipine  30-day supply with refills. BP Readings from Last 3 Encounters:  07/11/24 132/82  04/07/24 130/88  01/09/24 132/82

## 2024-07-13 ENCOUNTER — Ambulatory Visit: Payer: Self-pay | Admitting: Physician Assistant

## 2024-07-22 ENCOUNTER — Other Ambulatory Visit: Payer: Self-pay | Admitting: Family Medicine

## 2024-07-22 MED ORDER — GEMTESA 75 MG PO TABS: 75.0000 mg | ORAL_TABLET | Freq: Every day | ORAL | 5 refills | Status: DC

## 2024-07-30 ENCOUNTER — Other Ambulatory Visit: Payer: Self-pay | Admitting: Family Medicine

## 2024-07-30 MED ORDER — SOLIFENACIN SUCCINATE 5 MG PO TABS: 5.0000 mg | ORAL_TABLET | Freq: Every day | ORAL | 0 refills | Status: AC

## 2024-08-08 ENCOUNTER — Other Ambulatory Visit: Payer: Self-pay | Admitting: Physician Assistant

## 2024-08-08 DIAGNOSIS — N529 Male erectile dysfunction, unspecified: Secondary | ICD-10-CM

## 2024-08-08 DIAGNOSIS — N401 Enlarged prostate with lower urinary tract symptoms: Secondary | ICD-10-CM

## 2024-10-08 ENCOUNTER — Other Ambulatory Visit: Payer: Self-pay | Admitting: Family Medicine

## 2024-10-08 DIAGNOSIS — N529 Male erectile dysfunction, unspecified: Secondary | ICD-10-CM

## 2024-10-08 DIAGNOSIS — N401 Enlarged prostate with lower urinary tract symptoms: Secondary | ICD-10-CM

## 2024-11-07 ENCOUNTER — Ambulatory Visit: Admitting: Physician Assistant
